# Patient Record
Sex: Male | Born: 1935 | Race: White | Hispanic: No | Marital: Single | Smoking: Never smoker
Health system: Southern US, Community
[De-identification: ages and names within clinical notes are randomized; demographics above are authoritative.]

## PROBLEM LIST (undated history)

## (undated) DIAGNOSIS — I10 Essential (primary) hypertension: Secondary | ICD-10-CM

## (undated) DIAGNOSIS — M1990 Unspecified osteoarthritis, unspecified site: Secondary | ICD-10-CM

## (undated) DIAGNOSIS — F0390 Unspecified dementia without behavioral disturbance: Secondary | ICD-10-CM

## (undated) DIAGNOSIS — E119 Type 2 diabetes mellitus without complications: Secondary | ICD-10-CM

## (undated) HISTORY — PX: NO PAST SURGERIES: SHX2092

---

## 2003-04-20 ENCOUNTER — Encounter: Payer: Self-pay | Admitting: Orthopaedic Surgery

## 2003-04-20 ENCOUNTER — Inpatient Hospital Stay (HOSPITAL_COMMUNITY): Admission: AD | Admit: 2003-04-20 | Discharge: 2003-04-22 | Payer: Self-pay | Admitting: Emergency Medicine

## 2003-08-14 ENCOUNTER — Encounter: Admission: RE | Admit: 2003-08-14 | Discharge: 2003-08-14 | Payer: Self-pay | Admitting: Orthopaedic Surgery

## 2003-09-04 ENCOUNTER — Encounter: Admission: RE | Admit: 2003-09-04 | Discharge: 2003-09-04 | Payer: Self-pay | Admitting: Orthopaedic Surgery

## 2003-09-22 ENCOUNTER — Encounter: Admission: RE | Admit: 2003-09-22 | Discharge: 2003-09-22 | Payer: Self-pay | Admitting: Orthopaedic Surgery

## 2003-10-09 ENCOUNTER — Encounter: Admission: RE | Admit: 2003-10-09 | Discharge: 2003-10-09 | Payer: Self-pay | Admitting: Orthopaedic Surgery

## 2005-11-15 ENCOUNTER — Encounter: Admission: RE | Admit: 2005-11-15 | Discharge: 2005-11-15 | Payer: Self-pay | Admitting: Orthopedic Surgery

## 2005-11-18 ENCOUNTER — Ambulatory Visit (HOSPITAL_BASED_OUTPATIENT_CLINIC_OR_DEPARTMENT_OTHER): Admission: RE | Admit: 2005-11-18 | Discharge: 2005-11-18 | Payer: Self-pay | Admitting: Orthopedic Surgery

## 2010-05-07 ENCOUNTER — Encounter: Admission: RE | Admit: 2010-05-07 | Discharge: 2010-05-07 | Payer: Self-pay | Admitting: Family Medicine
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
## 2010-12-10 NOTE — Op Note (Signed)
NAMEJANTZEN, Johnny Hayden               ACCOUNT NO.:  [id number]   MEDICAL RECORD NO.:  [id number]          PATIENT TYPE:  AMB   LOCATION:  DSC                          FACILITY:  [facility]   PHYSICIAN:  Katy Fitch. Sypher, M.D. 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   OPERATIONS:  Release of right transverse carpal ligament.   SURGEON:  Katy Fitch. Sypher, M.D.   ASSISTANT:  Marveen Reeks. Dasnoit, P.A.-C.   ANESTHESIA:  General by LMA.   SUPERVISING ANESTHESIOLOGIST:  Janetta Hora. Gelene Mink, M.D.   INDICATIONS:  Johnny Hayden is a 77 year old gentleman referred by Dr. Benedetto Goad of [facility] for evaluation and management of hand  numbness and discomfort.  Clinical examination revealed a well-appearing 7-  year-old gentleman.  He has a history of type 2 diabetes that is well  managed with oral hypoglycemic therapy and diet.  He had a signs of  diminished sensitivity in his median distribution bilaterally.  Electrodiagnostic were completed revealing moderately severe bilateral  carpal tunnel syndrome, right worse than left.  Due to a failure to respond  to nonoperative management, he is brought to the operating room at this time  for release of his right transverse carpal ligament.   PROCEDURE:  Johnny Hayden is brought to the operating room and placed in  supine position on the operating table.  Following the induction of general  anesthesia by LMA technique, the right arm was prepped with Betadine soap  solution and sterilely draped.  A pneumatic tourniquet applied to the  proximal right brachium.  Following exsanguination of the limb with an  Esmarch  bandage, the arterial tourniquet was inflated to 220 mmHg.   The procedure commenced with a short incision in the line of the ring finger  of the palm.  The subcutaneous tissues were carefully divided revealing the  palmar fascia.  This was spilt longitudinally to the common sensory branch  of the median nerve.  These were followed back to the transverse carpal  ligament which was gently isolated from the median nerve.  A Penfield #4  elevator was passed repeatedly to clear the bursa from the deep surface of  the transverse carpal ligament.  The ligament was then released along its  ulnar border extending into the distal forearm.  This widely opened the  carpal canal.  No mass or other predicaments were noted.   Bleeding points along the margin of the loose ligament were  electrocauterized with bipolar forceps.  The wound was then repaired with  intradermal 3-0 Prolene.  A compressive dressing was applied with a volar  plaster splint maintaining the wrist in 5 degrees of dorsiflexion.   Preoperatively, Johnny Hayden uric acid was checked.  He is on chronic  Allopurinol therapy.  His uric acid  6.1, which is normal.   He is advised to elevate his hand for the next four days.  He will return to  our office for follow-up one week for dressing change.  We anticipate  beginning a therapy program three weeks postop.      Katy Fitch Sypher, M.D.  Electronically Signed     RVS/MEDQ  D:  11/18/2005  T:  11/18/2005  Job:  [id number]   cc:   Gloriajean Dell. Andrey Campanile, M.D.  Fax: [phone]

## 2010-12-10 NOTE — Op Note (Signed)
NAME:  Johnny Hayden, Johnny Hayden                         ACCOUNT NO.:  [id number]   MEDICAL RECORD NO.:  [id number]                   PATIENT TYPE:  INP   LOCATION:  4731                                 FACILITY:  [facility]   PHYSICIAN:  Claude Manges. Cleophas Dunker, M.D.            DATE OF BIRTH:  [birth year]   DATE OF PROCEDURE:  04/20/2003  DATE OF DISCHARGE:                                 OPERATIVE REPORT   PREOPERATIVE DIAGNOSIS:  Displaced left lateral tibial plateau fracture  (split type).   POSTOPERATIVE DIAGNOSIS:  Displaced left lateral tibial plateau fracture  (split type).   PROCEDURE:  Open reduction and internal fixation.   SURGEON:  Claude Manges. Cleophas Dunker, M.D.   ASSISTANT:  Legrand Pitts. Duffy, P.A.   ANESTHESIA:  General orotracheal.   COMPLICATIONS:  None.   DESCRIPTION OF PROCEDURE:  With the patient comfortable on the operating  table and under general orotracheal anesthesia, the left lower extremity was  placed in a thigh tourniquet.  The leg was then prepped with Betadine scrub  and then Duraprep from the tourniquet to the midfoot.  Sterile draping was  performed.  With the extremity still elevated, it was Esmarch-exsanguinated  with a proximal tourniquet at 350 mmHg.   A longitudinal incision was made centered about 2 cm lateral to the tibial  crest.  Via sharp dissection the incision was carried down through the  subcutaneous tissue.  Upon entering the anterior compartment fascia, there  was obvious hematoma.  Fascia had been split and the tibial plateau fracture  was identified.  There was at least a centimeter in separation between the  lateral fragment and the main body of the tibia.  The longitudinal crack was  located through the lateral joint.  There was a large hematoma, which was  evacuated.  Irrigation was then performed to visualize the fracture site,  and there were small shards of bone that were removed as they were impairing  reduction.  A portion of the fascia was  then separated from the fragment  anteriorly to better visualize the fracture.  Several attempts were made to  reduce the fracture and finally with clamps above and proximal and distal,  we felt we had an excellent position with minimal offset and good closure of  the wound.  We felt at that point that rather than doing a lot of muscle-  stripping that we could stabilize the fracture with transverse screws.  The  Ace cannulated titanium screw set was utilized.  Maintaining the position of  the fracture with the clamps, a 4.5 cortical screw was placed in the distal  portion of the fragment.  Appropriate drilling was measuring with the screw,  and they had excellent fit of the screw with nice compression.   Two 6.5 cancellous screws were then placed more proximally across the  plateau after appropriate drilling and measuring.  The more proximal screw  was inserted with  a washer.  We had excellent compression across the  fracture site.  There was still some comminution of the cortex posteriorly  and distally, and we packed corticocancellous bone chips.  Image  intensification revealed excellent closure of the fracture gap.  There may  have been a millimeter or so of offset at the joint, but we were able to  visualize the joint, felt we had an excellent reduction.  The wound was  copiously irrigated with antibiotic and saline solution throughout the  operative procedure, placing the knee through a full extension and then over  95 degrees of flexion.  There was absolutely no motion across the fracture  site.  Again we checked the image intensification in AP and lateral  projections and felt we had an excellent fixation.   The deep fascia was closed with a running 0 Vicryl, the subcu with a 2-0  Vicryl, skin closed with skin clips.  Marcaine 0.25% without epinephrine  injected in the wound edges.  A sterile bulky dressing was applied, followed  by an Ace bandage and a knee immobilizer.   The  patient tolerated the procedure without complications.                                               Claude Manges. Cleophas Dunker, M.D.    PWW/MEDQ  D:  04/20/2003  T:  04/21/2003  Job:  [id number]

## 2012-03-13 ENCOUNTER — Other Ambulatory Visit: Payer: Self-pay | Admitting: Neurology

## 2012-03-13 DIAGNOSIS — R2681 Unsteadiness on feet: Secondary | ICD-10-CM

## 2012-03-16 ENCOUNTER — Ambulatory Visit (HOSPITAL_COMMUNITY)
Admission: RE | Admit: 2012-03-16 | Discharge: 2012-03-16 | Disposition: A | Discharge status: Home / Self Care | Payer: Medicare Other | Source: Ambulatory Visit | Attending: Neurology | Admitting: Neurology

## 2012-03-16 DIAGNOSIS — G319 Degenerative disease of nervous system, unspecified: Secondary | ICD-10-CM | POA: Insufficient documentation

## 2012-03-16 DIAGNOSIS — R2681 Unsteadiness on feet: Secondary | ICD-10-CM

## 2012-03-16 DIAGNOSIS — R269 Unspecified abnormalities of gait and mobility: Secondary | ICD-10-CM | POA: Insufficient documentation

## 2013-01-01 ENCOUNTER — Inpatient Hospital Stay (HOSPITAL_COMMUNITY)
Admission: EM | Admit: 2013-01-01 | Discharge: 2013-01-05 | DRG: 309 | Disposition: A | Discharge status: Skilled Nursing Facility | Payer: Medicare Other | Attending: Family Medicine | Admitting: Family Medicine

## 2013-01-01 ENCOUNTER — Inpatient Hospital Stay (HOSPITAL_COMMUNITY): Payer: Medicare Other

## 2013-01-01 ENCOUNTER — Emergency Department (HOSPITAL_COMMUNITY): Payer: Medicare Other

## 2013-01-01 ENCOUNTER — Encounter (HOSPITAL_COMMUNITY): Payer: Self-pay | Admitting: Emergency Medicine

## 2013-01-01 DIAGNOSIS — I498 Other specified cardiac arrhythmias: Secondary | ICD-10-CM | POA: Diagnosis present

## 2013-01-01 DIAGNOSIS — E1169 Type 2 diabetes mellitus with other specified complication: Secondary | ICD-10-CM | POA: Diagnosis present

## 2013-01-01 DIAGNOSIS — IMO0002 Reserved for concepts with insufficient information to code with codable children: Secondary | ICD-10-CM | POA: Diagnosis present

## 2013-01-01 DIAGNOSIS — E119 Type 2 diabetes mellitus without complications: Secondary | ICD-10-CM

## 2013-01-01 DIAGNOSIS — F0390 Unspecified dementia without behavioral disturbance: Secondary | ICD-10-CM | POA: Diagnosis present

## 2013-01-01 DIAGNOSIS — R404 Transient alteration of awareness: Secondary | ICD-10-CM | POA: Diagnosis present

## 2013-01-01 DIAGNOSIS — E1165 Type 2 diabetes mellitus with hyperglycemia: Secondary | ICD-10-CM | POA: Diagnosis present

## 2013-01-01 DIAGNOSIS — R410 Disorientation, unspecified: Secondary | ICD-10-CM | POA: Diagnosis present

## 2013-01-01 DIAGNOSIS — I4892 Unspecified atrial flutter: Principal | ICD-10-CM | POA: Diagnosis present

## 2013-01-01 DIAGNOSIS — N390 Urinary tract infection, site not specified: Secondary | ICD-10-CM | POA: Diagnosis present

## 2013-01-01 DIAGNOSIS — E162 Hypoglycemia, unspecified: Secondary | ICD-10-CM | POA: Diagnosis present

## 2013-01-01 DIAGNOSIS — B9689 Other specified bacterial agents as the cause of diseases classified elsewhere: Secondary | ICD-10-CM | POA: Diagnosis present

## 2013-01-01 DIAGNOSIS — R55 Syncope and collapse: Secondary | ICD-10-CM | POA: Diagnosis present

## 2013-01-01 DIAGNOSIS — R270 Ataxia, unspecified: Secondary | ICD-10-CM

## 2013-01-01 DIAGNOSIS — I10 Essential (primary) hypertension: Secondary | ICD-10-CM | POA: Diagnosis present

## 2013-01-01 HISTORY — DX: Unspecified osteoarthritis, unspecified site: M19.90

## 2013-01-01 HISTORY — DX: Type 2 diabetes mellitus without complications: E11.9

## 2013-01-01 HISTORY — DX: Essential (primary) hypertension: I10

## 2013-01-01 HISTORY — DX: Unspecified dementia, unspecified severity, without behavioral disturbance, psychotic disturbance, mood disturbance, and anxiety: F03.90

## 2013-01-01 LAB — URINALYSIS, ROUTINE W REFLEX MICROSCOPIC
Bilirubin Urine: NEGATIVE
Hgb urine dipstick: NEGATIVE
Nitrite: NEGATIVE
Protein, ur: NEGATIVE mg/dL
Specific Gravity, Urine: 1.018 (ref 1.005–1.030)
Urobilinogen, UA: 1 mg/dL (ref 0.0–1.0)

## 2013-01-01 LAB — HEPATIC FUNCTION PANEL
ALT: 26 U/L (ref 0–53)
AST: 34 U/L (ref 0–37)
Albumin: 3.5 g/dL (ref 3.5–5.2)
Indirect Bilirubin: 0.5 mg/dL (ref 0.3–0.9)
Total Protein: 6.6 g/dL (ref 6.0–8.3)

## 2013-01-01 LAB — CBC WITH DIFFERENTIAL/PLATELET
Basophils Absolute: 0 10*3/uL (ref 0.0–0.1)
Basophils Relative: 0 % (ref 0–1)
Eosinophils Absolute: 0 10*3/uL (ref 0.0–0.7)
HCT: 40.7 % (ref 39.0–52.0)
Hemoglobin: 14.8 g/dL (ref 13.0–17.0)
MCH: 33.5 pg (ref 26.0–34.0)
MCHC: 36.4 g/dL — ABNORMAL HIGH (ref 30.0–36.0)
Monocytes Absolute: 0.3 10*3/uL (ref 0.1–1.0)
Monocytes Relative: 6 % (ref 3–12)
Neutro Abs: 3.4 10*3/uL (ref 1.7–7.7)
Neutrophils Relative %: 79 % — ABNORMAL HIGH (ref 43–77)
RDW: 12.4 % (ref 11.5–15.5)

## 2013-01-01 LAB — POCT I-STAT, CHEM 8
BUN: 19 mg/dL (ref 6–23)
Calcium, Ion: 1.13 mmol/L (ref 1.13–1.30)
Creatinine, Ser: 0.8 mg/dL (ref 0.50–1.35)
Glucose, Bld: 105 mg/dL — ABNORMAL HIGH (ref 70–99)
Hemoglobin: 14.6 g/dL (ref 13.0–17.0)
TCO2: 23 mmol/L (ref 0–100)

## 2013-01-01 LAB — RAPID URINE DRUG SCREEN, HOSP PERFORMED: Benzodiazepines: NOT DETECTED

## 2013-01-01 MED ORDER — LORAZEPAM 2 MG/ML IJ SOLN
1.0000 mg | Freq: Four times a day (QID) | INTRAMUSCULAR | Status: AC | PRN
  Administered 2013-01-02 – 2013-01-04 (×4): 1 mg via INTRAVENOUS
  Filled 2013-01-01 (×4): qty 1

## 2013-01-01 MED ORDER — ADULT MULTIVITAMIN W/MINERALS CH
1.0000 | ORAL_TABLET | Freq: Every day | ORAL | Status: DC
  Administered 2013-01-01 – 2013-01-05 (×5): 1 via ORAL
  Filled 2013-01-01 (×5): qty 1

## 2013-01-01 MED ORDER — HYDRALAZINE HCL 20 MG/ML IJ SOLN
10.0000 mg | Freq: Four times a day (QID) | INTRAMUSCULAR | Status: DC | PRN
  Administered 2013-01-03: 10 mg via INTRAVENOUS
  Filled 2013-01-01: qty 0.5
  Filled 2013-01-01: qty 1

## 2013-01-01 MED ORDER — VITAMIN B-1 100 MG PO TABS
100.0000 mg | ORAL_TABLET | Freq: Every day | ORAL | Status: DC
  Administered 2013-01-01 – 2013-01-05 (×5): 100 mg via ORAL
  Filled 2013-01-01 (×5): qty 1

## 2013-01-01 MED ORDER — HALOPERIDOL LACTATE 5 MG/ML IJ SOLN
1.0000 mg | Freq: Four times a day (QID) | INTRAMUSCULAR | Status: DC | PRN
  Administered 2013-01-01 – 2013-01-04 (×4): 1 mg via INTRAVENOUS
  Filled 2013-01-01 (×4): qty 1

## 2013-01-01 MED ORDER — ONDANSETRON HCL 4 MG PO TABS: 4.0000 mg | ORAL_TABLET | Freq: Four times a day (QID) | ORAL | Status: DC | PRN

## 2013-01-01 MED ORDER — THIAMINE HCL 100 MG/ML IJ SOLN
100.0000 mg | Freq: Every day | INTRAMUSCULAR | Status: DC
  Filled 2013-01-01 (×4): qty 1

## 2013-01-01 MED ORDER — LORAZEPAM 1 MG PO TABS
1.0000 mg | ORAL_TABLET | Freq: Four times a day (QID) | ORAL | Status: AC | PRN
  Administered 2013-01-01: 1 mg via ORAL
  Filled 2013-01-01 (×2): qty 1

## 2013-01-01 MED ORDER — SODIUM CHLORIDE 0.9 % IV BOLUS (SEPSIS)
1000.0000 mL | Freq: Once | INTRAVENOUS | Status: AC
  Administered 2013-01-01: 1000 mL via INTRAVENOUS

## 2013-01-01 MED ORDER — SODIUM CHLORIDE 0.9 % IJ SOLN
3.0000 mL | Freq: Two times a day (BID) | INTRAMUSCULAR | Status: DC
  Administered 2013-01-01 – 2013-01-04 (×6): 3 mL via INTRAVENOUS

## 2013-01-01 MED ORDER — FOLIC ACID 1 MG PO TABS
1.0000 mg | ORAL_TABLET | Freq: Every day | ORAL | Status: DC
  Administered 2013-01-01 – 2013-01-05 (×5): 1 mg via ORAL
  Filled 2013-01-01 (×5): qty 1

## 2013-01-01 MED ORDER — ONDANSETRON HCL 4 MG/2ML IJ SOLN: 4.0000 mg | Freq: Four times a day (QID) | INTRAMUSCULAR | Status: DC | PRN

## 2013-01-01 MED ORDER — HYDROCODONE-ACETAMINOPHEN 5-325 MG PO TABS: 1.0000 | ORAL_TABLET | ORAL | Status: DC | PRN

## 2013-01-01 MED ORDER — POLYETHYLENE GLYCOL 3350 17 G PO PACK
17.0000 g | PACK | Freq: Every day | ORAL | Status: DC | PRN
  Administered 2013-01-04: 17 g via ORAL
  Filled 2013-01-01 (×2): qty 1

## 2013-01-01 MED ORDER — METOPROLOL TARTRATE 50 MG PO TABS
50.0000 mg | ORAL_TABLET | Freq: Two times a day (BID) | ORAL | Status: DC
  Administered 2013-01-01 – 2013-01-02 (×2): 50 mg via ORAL
  Filled 2013-01-01: qty 2
  Filled 2013-01-01 (×3): qty 1

## 2013-01-01 MED ORDER — GUAIFENESIN-DM 100-10 MG/5ML PO SYRP: 5.0000 mL | ORAL_SOLUTION | ORAL | Status: DC | PRN

## 2013-01-01 MED ORDER — KCL IN DEXTROSE-NACL 10-5-0.45 MEQ/L-%-% IV SOLN
INTRAVENOUS | Status: AC
  Administered 2013-01-01: 18:00:00 via INTRAVENOUS
  Filled 2013-01-01 (×2): qty 1000

## 2013-01-01 MED ORDER — ALBUTEROL SULFATE (5 MG/ML) 0.5% IN NEBU: 2.5000 mg | INHALATION_SOLUTION | RESPIRATORY_TRACT | Status: DC | PRN

## 2013-01-01 MED ORDER — MAGNESIUM SULFATE 40 MG/ML IJ SOLN
2.0000 g | Freq: Once | INTRAMUSCULAR | Status: AC
  Administered 2013-01-01: 2 g via INTRAVENOUS
  Filled 2013-01-01: qty 50
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
## 2013-01-01 NOTE — Progress Notes (Signed)
UR completed 
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
## 2013-01-01 NOTE — Progress Notes (Signed)
Patient's sister Christella Noa) in to visit with patient. Sister states " she would like to speak with Social Work or Case Management concerning brothers well being.   Christella Noa: [phone] contact number. She resides in [place], [place].  Number also placed to chart front. Also informed us that patient likes to be called "Fields".

## 2013-01-01 NOTE — Progress Notes (Signed)
Consulted by ED SW Paged Dr Thedore Mins at [phone]

## 2013-01-01 NOTE — Progress Notes (Addendum)
Acknowledged lead off alarm, phone not yet transferred to me. Called Chrissie at the [facility] [employer] to inform of replaced leads. Pt confused continues to pull off leads and pulled out IV.  Prn Ativan1 mg PO given. IV team called for restart. Pt placed on Bed alarm and Nurse Tech updated concerning patients current status. Per secretary relative called and will be visiting to check on Patients condition. Will continue to monitor patient per Doctor orders and unit protocol.

## 2013-01-01 NOTE — Progress Notes (Signed)
CSW attempted to assess patient in the emergency department, however pt was being transported to inpatient floor. CSW will inform unit csw of patient needs.   Catha Gosselin, LCSWA  [phone] 01/01/2013 1607pm

## 2013-01-01 NOTE — Progress Notes (Signed)
   CARE MANAGEMENT ED NOTE 01/01/2013  Patient:  Johnny Hayden, Johnny Hayden   Account Number:  [id number]  Date Initiated:  01/01/2013  Documentation initiated by:    Subjective/Objective Assessment:     Subjective/Objective Assessment Detail:     Action/Plan:   Action/Plan Detail:   Anticipated DC Date:       Status Recommendation to Physician:   Result of Recommendation:    Other ED Services  Consult Working Plan    DC Planning Services  Other  PCP issues    Choice offered to / List presented to:            Status of service:  Completed, signed off  ED Comments:   ED Comments Detail:  Patient listed as not having a PCP.  EDCM spoke to patient who stated that Dr. Gay Filler of [facility] is his primary doctor.  Offered support to patient.  No further needs at this time.

## 2013-01-01 NOTE — ED Provider Notes (Signed)
History     CSN: [id number]  Arrival date & time 01/01/13  1229   First MD Initiated Contact with Patient 01/01/13 1241      Chief Complaint  Patient presents with  . Hypoglycemia    (Consider location/radiation/quality/duration/timing/severity/associated sxs/prior treatment) Patient is a 79 y.o. male presenting with altered mental status. The history is provided by the EMS personnel (the pt had been living outside for 3 days and could not get in his house). No language interpreter was used.  Altered Mental Status Presenting symptoms: behavior changes   Presenting symptoms: no combativeness   Severity:  Moderate Most recent episode:  Today Episode history:  Multiple Timing:  Constant Progression:  Unchanged   Past Medical History  Diagnosis Date  . Hypertension   . Diabetes mellitus without complication     History reviewed. No pertinent past surgical history.  History reviewed. No pertinent family history.  History  Substance Use Topics  . Smoking status: Not on file  . Smokeless tobacco: Not on file  . Alcohol Use: Not on file      Review of Systems  Unable to perform ROS: Dementia  Psychiatric/Behavioral: Positive for altered mental status.    Allergies  Review of patient's allergies indicates not on file.  Home Medications  No current outpatient prescriptions on file.  BP 177/83  Pulse 64  Temp(Src) 97.5 F (36.4 C) (Oral)  Resp 13  SpO2 98%  Physical Exam  Constitutional: He appears well-developed.  HENT:  Head: Normocephalic.  Eyes: Conjunctivae and EOM are normal. No scleral icterus.  Neck: Neck supple. No thyromegaly present.  Cardiovascular: Normal rate and regular rhythm.  Exam reveals no gallop and no friction rub.   No murmur heard. Pulmonary/Chest: No stridor. He has no wheezes. He has no rales. He exhibits no tenderness.  Abdominal: He exhibits no distension. There is no tenderness. There is no rebound.  Musculoskeletal: Normal  range of motion. He exhibits no edema.  Lymphadenopathy:    He has no cervical adenopathy.  Neurological: He is alert. Coordination normal.  Pt only oriented to person.  Pt also ataxic  Skin: No rash noted. No erythema.  Psychiatric: He has a normal mood and affect. His behavior is normal.    ED Course  Procedures (including critical care time)  Labs Reviewed  CBC WITH DIFFERENTIAL - Abnormal; Notable for the following:    MCHC 36.4 (*)    Neutrophils Relative % 79 (*)    Lymphs Abs 0.6 (*)    All other components within normal limits  GLUCOSE, CAPILLARY - Abnormal; Notable for the following:    Glucose-Capillary 67 (*)    All other components within normal limits  POCT I-STAT, CHEM 8 - Abnormal; Notable for the following:    Glucose, Bld 105 (*)    All other components within normal limits  URINE CULTURE  URINALYSIS, ROUTINE W REFLEX MICROSCOPIC  HEPATIC FUNCTION PANEL  ETHANOL  CK  HEMOGLOBIN A1C  URINE RAPID DRUG SCREEN (HOSP PERFORMED)  C-PEPTIDE  TSH   Dg Chest 1 View  01/01/2013   *RADIOLOGY REPORT*  Clinical Data: Fall, weakness  CHEST - 1 VIEW  Comparison: 11/15/2005  Findings: Cardiomediastinal silhouette is stable.  No acute infiltrate or pleural effusion.  No pulmonary edema.  Degenerative changes are noted right AC joint.  IMPRESSION: No active disease.   Original Report Authenticated By: Natasha Mead, M.D.   Ct Head Wo Contrast  01/01/2013   *RADIOLOGY REPORT*  Clinical Data:  Hypoglycemia  CT HEAD WITHOUT CONTRAST CT MAXILLOFACIAL WITHOUT CONTRAST CT CERVICAL SPINE WITHOUT CONTRAST  Technique:  Multidetector CT imaging of the head, cervical spine, and maxillofacial structures were performed using the standard protocol without intravenous contrast. Multiplanar CT image reconstructions of the cervical spine and maxillofacial structures were also generated.  Comparison:   None  CT HEAD  Findings: The ventricular system is prominent, as are the cortical sulci diffusely  thickened, consistent with atrophy.  Moderate small vessel ischemic change is noted throughout the periventricular white matter.  No hemorrhage, mass lesion, or acute infarction is seen.  On bone window images, no calvarial abnormality is noted. The paranasal sinuses that are visualized are clear.  IMPRESSION: Moderate atrophy and small vessel ischemic change.  No acute intracranial abnormality.  CT MAXILLOFACIAL  Findings:  No maxillofacial fracture is seen.  The orbital rims and zygomatic arches are intact.  No nasal bone fracture is seen. There is a probable retention cyst noted in the anterior left maxillary sinus, but no sinusitis is seen.  The nasal turbinates are normal in size and position.  IMPRESSION: No maxillofacial fracture is seen.  The sinuses are clear.  CT CERVICAL SPINE  Findings:   The cervical vertebrae are in normal alignment.  There is degenerative disc disease primarily at C4-5, C5-6, and C6-7 levels with loss of disc space and spurring.  Posterior osteophytes at C5-6 and C6-7 levels somewhat narrow the central canal.  No acute fracture is seen.  The odontoid process is intact.  There are diffuse degenerative changes throughout the facet joints of the cervical spine.  Foraminal narrowing is noted at multiple levels as well.  The thyroid gland is unremarkable.  IMPRESSION:  1.  Diffuse degenerative disc disease with some foraminal narrowing and mild central canal narrowing at several levels.  2.  Osteopenia.  No acute fracture.   Original Report Authenticated By: Dwyane Dee, M.D.   Ct Cervical Spine Wo Contrast  01/01/2013   *RADIOLOGY REPORT*  Clinical Data:  Hypoglycemia  CT HEAD WITHOUT CONTRAST CT MAXILLOFACIAL WITHOUT CONTRAST CT CERVICAL SPINE WITHOUT CONTRAST  Technique:  Multidetector CT imaging of the head, cervical spine, and maxillofacial structures were performed using the standard protocol without intravenous contrast. Multiplanar CT image reconstructions of the cervical spine  and maxillofacial structures were also generated.  Comparison:   None  CT HEAD  Findings: The ventricular system is prominent, as are the cortical sulci diffusely thickened, consistent with atrophy.  Moderate small vessel ischemic change is noted throughout the periventricular white matter.  No hemorrhage, mass lesion, or acute infarction is seen.  On bone window images, no calvarial abnormality is noted. The paranasal sinuses that are visualized are clear.  IMPRESSION: Moderate atrophy and small vessel ischemic change.  No acute intracranial abnormality.  CT MAXILLOFACIAL  Findings:  No maxillofacial fracture is seen.  The orbital rims and zygomatic arches are intact.  No nasal bone fracture is seen. There is a probable retention cyst noted in the anterior left maxillary sinus, but no sinusitis is seen.  The nasal turbinates are normal in size and position.  IMPRESSION: No maxillofacial fracture is seen.  The sinuses are clear.  CT CERVICAL SPINE  Findings:   The cervical vertebrae are in normal alignment.  There is degenerative disc disease primarily at C4-5, C5-6, and C6-7 levels with loss of disc space and spurring.  Posterior osteophytes at C5-6 and C6-7 levels somewhat narrow the central canal.  No acute fracture is seen.  The odontoid process is intact.  There are diffuse degenerative changes throughout the facet joints of the cervical spine.  Foraminal narrowing is noted at multiple levels as well.  The thyroid gland is unremarkable.  IMPRESSION:  1.  Diffuse degenerative disc disease with some foraminal narrowing and mild central canal narrowing at several levels.  2.  Osteopenia.  No acute fracture.   Original Report Authenticated By: Dwyane Dee, M.D.   Ct Maxillofacial Wo Cm  01/01/2013   *RADIOLOGY REPORT*  Clinical Data:  Hypoglycemia  CT HEAD WITHOUT CONTRAST CT MAXILLOFACIAL WITHOUT CONTRAST CT CERVICAL SPINE WITHOUT CONTRAST  Technique:  Multidetector CT imaging of the head, cervical spine, and  maxillofacial structures were performed using the standard protocol without intravenous contrast. Multiplanar CT image reconstructions of the cervical spine and maxillofacial structures were also generated.  Comparison:   None  CT HEAD  Findings: The ventricular system is prominent, as are the cortical sulci diffusely thickened, consistent with atrophy.  Moderate small vessel ischemic change is noted throughout the periventricular white matter.  No hemorrhage, mass lesion, or acute infarction is seen.  On bone window images, no calvarial abnormality is noted. The paranasal sinuses that are visualized are clear.  IMPRESSION: Moderate atrophy and small vessel ischemic change.  No acute intracranial abnormality.  CT MAXILLOFACIAL  Findings:  No maxillofacial fracture is seen.  The orbital rims and zygomatic arches are intact.  No nasal bone fracture is seen. There is a probable retention cyst noted in the anterior left maxillary sinus, but no sinusitis is seen.  The nasal turbinates are normal in size and position.  IMPRESSION: No maxillofacial fracture is seen.  The sinuses are clear.  CT CERVICAL SPINE  Findings:   The cervical vertebrae are in normal alignment.  There is degenerative disc disease primarily at C4-5, C5-6, and C6-7 levels with loss of disc space and spurring.  Posterior osteophytes at C5-6 and C6-7 levels somewhat narrow the central canal.  No acute fracture is seen.  The odontoid process is intact.  There are diffuse degenerative changes throughout the facet joints of the cervical spine.  Foraminal narrowing is noted at multiple levels as well.  The thyroid gland is unremarkable.  IMPRESSION:  1.  Diffuse degenerative disc disease with some foraminal narrowing and mild central canal narrowing at several levels.  2.  Osteopenia.  No acute fracture.   Original Report Authenticated By: Dwyane Dee, M.D.     1. Diabetes mellitus without complication   2. Hypertension   3. Ataxia   4. Hypoglycemia        MDM          Benny Lennert, MD 01/01/13 1453

## 2013-01-01 NOTE — H&P (Signed)
[facility]                                                                                    Patient Demographics  Johnny Hayden, is a 79 y.o. male  CSN: [id number]  MRN: [id number]  DOB - 1932-07-17  Admit Date - 01/01/2013  Outpatient Primary MD for the patient is No primary provider on file.   With History of -  Past Medical History  Diagnosis Date  . Hypertension   . Diabetes mellitus without complication   . Arthritis   . Dementia       Past Surgical History  Procedure Laterality Date  . No past surgeries      in for   Chief Complaint  Patient presents with  . Hypoglycemia     HPI  Johnny Hayden  is a 79 y.o. male,  with history of advanced dementia and a very poor historian, 2 diabetes mellitus, hypertension, arthritis, who lives alone and according to his sister who are talked to over the phone is in poor state of health, according to the EMS report neighbors reported that patient was found lying in his front yard and was apparently locked out of his house for the past few days, patient himself is quite confused however he says that he has been lightheaded and falling over and over again for the last several days, he does not remember when he walked out of his house, he says he has multiple injuries all over but he mostly hurts over his right hip, when EMS found him he was lying in his front yard, he was found to have extensive bruising all over, he was also found to be hypoglycemic with a CBG of 56, he was then brought to the hospital for recurrent syncope, recurrent falls, hypoglycemia, in the ER his EKG was suggestive of possible atrial flutter versus sinus rhythm with artifact and PVCs, I was called to admit the patient for recurrent syncope with falls, hyperglycemia, possible atrial flutter of new origin.  Patient himself is a very poor historian due to his advanced dementia and can provide very limited history, he denies any headache or chest pain, no  palpitations, he does confirm recurrent syncopal episodes and falls whenever he tries to stand up, he agrees to right-sided hip pain. Does not remember what medications he takes, does not remember about his health issues besides that he is diabetic, he does not remember his PCP.    Review of Systems    In addition to the HPI above,  No Fever-chills, No Headache, No changes with Vision or hearing, No problems swallowing food or Liquids, No Chest pain, Cough or Shortness of Breath, No Abdominal pain, No Nausea or Vommitting, Bowel movements are regular, No Blood in stool or Urine, No dysuria, No new skin rashes or bruises, No new joints pains-aches, except some pain in the right hip, No new weakness, tingling, numbness in any extremity, he is persistently dizzy and syncopal when he stands up with multiple falls No recent weight gain or loss, No polyuria, polydypsia or polyphagia, No significant Mental Stressors.  A full 10 point Review of Systems was done, except  as stated above, all other Review of Systems were negative.   Social History History  Substance Use Topics  . Smoking status: Never Smoker   . Smokeless tobacco: Never Used  . Alcohol Use: Yes     Comment: "too much sometimes"      Family History CAD and patient's father per old chart  Prior to Admission medications   Not on File    Not on File  Physical Exam  Vitals  Blood pressure 177/83, pulse 64, temperature 97.5 F (36.4 C), temperature source Oral, resp. rate 13, SpO2 98.00%.   1. General frail elderly white male who is pleasantly confused, and extremely poor personal hygiene with multiple external bruising and small lacerations all over sitting in hospital bed in no apparent discomfort.  2. Normal affect and insight, Not Suicidal or Homicidal, Awake presently confused is oriented to place only  3. No F.N deficits, ALL C.Nerves Intact, Strength 5/5 all 4 extremities, Sensation intact all 4  extremities, Plantars down going.  4. Ears and Eyes appear Normal, Conjunctivae clear, PERRLA. Moist Oral Mucosa.  5. Supple Neck, No JVD, No cervical lymphadenopathy appriciated, No Carotid Bruits.  6. Symmetrical Chest wall movement, Good air movement bilaterally, CTAB.  7. RRR, No Gallops, Rubs or Murmurs, No Parasternal Heave.  8. Positive Bowel Sounds, Abdomen Soft, Non tender, No organomegaly appriciated,No rebound -guarding or rigidity.  9.  No Cyanosis, Normal Skin Turgor, No Skin Rash , multiple extensive bruises  10. Good muscle tone,  joints appear normal , no effusions, Normal ROM.  11. No Palpable Lymph Nodes in Neck or Axillae     Data Review  CBC  Recent Labs Lab 01/01/13 1305 01/01/13 1311  WBC 4.3  --   HGB 14.8 14.6  HCT 40.7 43.0  PLT 160  --   MCV 92.1  --   MCH 33.5  --   MCHC 36.4*  --   RDW 12.4  --   LYMPHSABS 0.6*  --   MONOABS 0.3  --   EOSABS 0.0  --   BASOSABS 0.0  --    ------------------------------------------------------------------------------------------------------------------  Chemistries   Recent Labs Lab 01/01/13 1311  NA 138  K 3.5  CL 101  GLUCOSE 105*  BUN 19  CREATININE 0.80   ------------------------------------------------------------------------------------------------------------------ CrCl is unknown because there is no height on file for the current visit. ------------------------------------------------------------------------------------------------------------------ No results found for this basename: TSH, T4TOTAL, FREET3, T3FREE, THYROIDAB,  in the last 72 hours   Coagulation profile No results found for this basename: INR, PROTIME,  in the last 168 hours ------------------------------------------------------------------------------------------------------------------- No results found for this basename: DDIMER,  in the last 72  hours -------------------------------------------------------------------------------------------------------------------  Cardiac Enzymes No results found for this basename: CK, CKMB, TROPONINI, MYOGLOBIN,  in the last 168 hours ------------------------------------------------------------------------------------------------------------------ No components found with this basename: POCBNP,    ---------------------------------------------------------------------------------------------------------------  Urinalysis No results found for this basename: colorurine, appearanceur, labspec, phurine, glucoseu, hgbur, bilirubinur, ketonesur, proteinur, urobilinogen, nitrite, leukocytesur    ----------------------------------------------------------------------------------------------------------------  Imaging results:   Dg Chest 1 View  01/01/2013   *RADIOLOGY REPORT*  Clinical Data: Fall, weakness  CHEST - 1 VIEW  Comparison: 11/15/2005  Findings: Cardiomediastinal silhouette is stable.  No acute infiltrate or pleural effusion.  No pulmonary edema.  Degenerative changes are noted right AC joint.  IMPRESSION: No active disease.   Original Report Authenticated By: Natasha Mead, M.D.   Ct Head Wo Contrast  01/01/2013   *RADIOLOGY REPORT*  Clinical Data:  Hypoglycemia  CT  HEAD WITHOUT CONTRAST CT MAXILLOFACIAL WITHOUT CONTRAST CT CERVICAL SPINE WITHOUT CONTRAST  Technique:  Multidetector CT imaging of the head, cervical spine, and maxillofacial structures were performed using the standard protocol without intravenous contrast. Multiplanar CT image reconstructions of the cervical spine and maxillofacial structures were also generated.  Comparison:   None  CT HEAD  Findings: The ventricular system is prominent, as are the cortical sulci diffusely thickened, consistent with atrophy.  Moderate small vessel ischemic change is noted throughout the periventricular white matter.  No hemorrhage, mass lesion, or  acute infarction is seen.  On bone window images, no calvarial abnormality is noted. The paranasal sinuses that are visualized are clear.  IMPRESSION: Moderate atrophy and small vessel ischemic change.  No acute intracranial abnormality.  CT MAXILLOFACIAL  Findings:  No maxillofacial fracture is seen.  The orbital rims and zygomatic arches are intact.  No nasal bone fracture is seen. There is a probable retention cyst noted in the anterior left maxillary sinus, but no sinusitis is seen.  The nasal turbinates are normal in size and position.  IMPRESSION: No maxillofacial fracture is seen.  The sinuses are clear.  CT CERVICAL SPINE  Findings:   The cervical vertebrae are in normal alignment.  There is degenerative disc disease primarily at C4-5, C5-6, and C6-7 levels with loss of disc space and spurring.  Posterior osteophytes at C5-6 and C6-7 levels somewhat narrow the central canal.  No acute fracture is seen.  The odontoid process is intact.  There are diffuse degenerative changes throughout the facet joints of the cervical spine.  Foraminal narrowing is noted at multiple levels as well.  The thyroid gland is unremarkable.  IMPRESSION:  1.  Diffuse degenerative disc disease with some foraminal narrowing and mild central canal narrowing at several levels.  2.  Osteopenia.  No acute fracture.   Original Report Authenticated By: Dwyane Dee, M.D.   Ct Cervical Spine Wo Contrast  01/01/2013   *RADIOLOGY REPORT*  Clinical Data:  Hypoglycemia  CT HEAD WITHOUT CONTRAST CT MAXILLOFACIAL WITHOUT CONTRAST CT CERVICAL SPINE WITHOUT CONTRAST  Technique:  Multidetector CT imaging of the head, cervical spine, and maxillofacial structures were performed using the standard protocol without intravenous contrast. Multiplanar CT image reconstructions of the cervical spine and maxillofacial structures were also generated.  Comparison:   None  CT HEAD  Findings: The ventricular system is prominent, as are the cortical sulci diffusely  thickened, consistent with atrophy.  Moderate small vessel ischemic change is noted throughout the periventricular white matter.  No hemorrhage, mass lesion, or acute infarction is seen.  On bone window images, no calvarial abnormality is noted. The paranasal sinuses that are visualized are clear.  IMPRESSION: Moderate atrophy and small vessel ischemic change.  No acute intracranial abnormality.  CT MAXILLOFACIAL  Findings:  No maxillofacial fracture is seen.  The orbital rims and zygomatic arches are intact.  No nasal bone fracture is seen. There is a probable retention cyst noted in the anterior left maxillary sinus, but no sinusitis is seen.  The nasal turbinates are normal in size and position.  IMPRESSION: No maxillofacial fracture is seen.  The sinuses are clear.  CT CERVICAL SPINE  Findings:   The cervical vertebrae are in normal alignment.  There is degenerative disc disease primarily at C4-5, C5-6, and C6-7 levels with loss of disc space and spurring.  Posterior osteophytes at C5-6 and C6-7 levels somewhat narrow the central canal.  No acute fracture is seen.  The odontoid process  is intact.  There are diffuse degenerative changes throughout the facet joints of the cervical spine.  Foraminal narrowing is noted at multiple levels as well.  The thyroid gland is unremarkable.  IMPRESSION:  1.  Diffuse degenerative disc disease with some foraminal narrowing and mild central canal narrowing at several levels.  2.  Osteopenia.  No acute fracture.   Original Report Authenticated By: Dwyane Dee, M.D.   Ct Maxillofacial Wo Cm  01/01/2013   *RADIOLOGY REPORT*  Clinical Data:  Hypoglycemia  CT HEAD WITHOUT CONTRAST CT MAXILLOFACIAL WITHOUT CONTRAST CT CERVICAL SPINE WITHOUT CONTRAST  Technique:  Multidetector CT imaging of the head, cervical spine, and maxillofacial structures were performed using the standard protocol without intravenous contrast. Multiplanar CT image reconstructions of the cervical spine and  maxillofacial structures were also generated.  Comparison:   None  CT HEAD  Findings: The ventricular system is prominent, as are the cortical sulci diffusely thickened, consistent with atrophy.  Moderate small vessel ischemic change is noted throughout the periventricular white matter.  No hemorrhage, mass lesion, or acute infarction is seen.  On bone window images, no calvarial abnormality is noted. The paranasal sinuses that are visualized are clear.  IMPRESSION: Moderate atrophy and small vessel ischemic change.  No acute intracranial abnormality.  CT MAXILLOFACIAL  Findings:  No maxillofacial fracture is seen.  The orbital rims and zygomatic arches are intact.  No nasal bone fracture is seen. There is a probable retention cyst noted in the anterior left maxillary sinus, but no sinusitis is seen.  The nasal turbinates are normal in size and position.  IMPRESSION: No maxillofacial fracture is seen.  The sinuses are clear.  CT CERVICAL SPINE  Findings:   The cervical vertebrae are in normal alignment.  There is degenerative disc disease primarily at C4-5, C5-6, and C6-7 levels with loss of disc space and spurring.  Posterior osteophytes at C5-6 and C6-7 levels somewhat narrow the central canal.  No acute fracture is seen.  The odontoid process is intact.  There are diffuse degenerative changes throughout the facet joints of the cervical spine.  Foraminal narrowing is noted at multiple levels as well.  The thyroid gland is unremarkable.  IMPRESSION:  1.  Diffuse degenerative disc disease with some foraminal narrowing and mild central canal narrowing at several levels.  2.  Osteopenia.  No acute fracture.   Original Report Authenticated By: Dwyane Dee, M.D.    My personal review of EKG: Rhythm NSR with PVCs and artifact versus atrial flutter,  no Acute ST changes    Assessment & Plan    1. Recurrent syncope and falls - unclear etiology, possible new onset atrial flutter on EKG, patient will be admitted to  a telemetry bed, syncope workup will be initiated which will include tele monitoring, echogram, carotid duplex ultrasound, checking orthostatics, CT of the brain is unremarkable will check MRI of the brain, PT will evaluate, we'll also have social work evaluate for placement.   2. Questionable new onset atrial flutter. This could be artifact on EKG with frequent PVCs, his baseline is extremely poor due to recurrent motion, he will be monitored on telemetry, will check TSH and echogram, will place him on better blocker but for his hypertension and for rate control, off note he will be a very poor candidate for long-term anticoagulation regardless of his chair to score due to his advanced dementia and extremely high risk of fall. QTC was close to 500, we'll give him 1 g of IV  magnesium and monitor magnesium levels daily.   3. Diabetes mellitus type 2 history with hypoglycemia. Unclear what medications he takes, he was apparently locked out of his house for 3 days without any food, hypoglycemia protocol, monitor CBGs closely, IV fluids with D5, check A1c and C-peptide.   4. Multiple bruising, right-sided hip pain. We'll check a CK level as he has been out of the house and has had recurrent falls, will also check x-ray of his right hip. PT evaluation.   5. Advanced dementia will be an extremely high risk for delirium.- Fall and aspiration precautions, when necessary Ativan and Haldol with close QTC monitoring on telemetry. Check baseline B12.   6. History of alcohol use/abuse. He does not give a reliable history, sister says that he drinks quite a bit, I will check his B12 level, place him on thiamine and folate, when necessary IV Ativan per CIWA protocol.   7. Hypertension. Place him on Lopressor and monitor.    DVT Prophylaxis  SCDs    AM Labs Ordered, also please review Full Orders  Family Communication: Admission, patients condition and plan of care including tests being ordered have been  discussed with the patient and sister (phone) who indicate understanding and agree with the plan and Code Status.  Code Status Full  Likely DC to  SNF  Time spent in minutes : 35  Condition GUARDED   Leroy Sea M.D on 01/01/2013 at 3:17 PM  Between 7am to 7pm - Pager - [phone]  After 7pm go to [email or web address] - password [email or web address]  And look for the night coverage person covering me after hours  [facility] Office  [phone]

## 2013-01-01 NOTE — Progress Notes (Signed)
ED CM spoke with Dr Thedore Mins who states "patient is meeting inpatient" Pending H&P

## 2013-01-01 NOTE — ED Notes (Signed)
Patient transported to X-ray 

## 2013-01-02 DIAGNOSIS — R55 Syncope and collapse: Secondary | ICD-10-CM

## 2013-01-02 DIAGNOSIS — I359 Nonrheumatic aortic valve disorder, unspecified: Secondary | ICD-10-CM

## 2013-01-02 LAB — HEMOGLOBIN A1C
Hgb A1c MFr Bld: 5.2 % (ref <5.7)
Mean Plasma Glucose: 103 mg/dL (ref <117)

## 2013-01-02 LAB — CBC
HCT: 36.1 % — ABNORMAL LOW (ref 39.0–52.0)
MCV: 91.9 fL (ref 78.0–100.0)
Platelets: 132 10*3/uL — ABNORMAL LOW (ref 150–400)
RBC: 3.93 MIL/uL — ABNORMAL LOW (ref 4.22–5.81)
RDW: 12.5 % (ref 11.5–15.5)
WBC: 4.4 10*3/uL (ref 4.0–10.5)

## 2013-01-02 LAB — GLUCOSE, CAPILLARY
Glucose-Capillary: 124 mg/dL — ABNORMAL HIGH (ref 70–99)
Glucose-Capillary: 103 mg/dL — ABNORMAL HIGH (ref 70–99)
Glucose-Capillary: 110 mg/dL — ABNORMAL HIGH (ref 70–99)
Glucose-Capillary: 138 mg/dL — ABNORMAL HIGH (ref 70–99)

## 2013-01-02 LAB — C-PEPTIDE: C-Peptide: 1.62 ng/mL (ref 0.80–3.90)

## 2013-01-02 LAB — BASIC METABOLIC PANEL
BUN: 14 mg/dL (ref 6–23)
CO2: 30 mEq/L (ref 19–32)
Chloride: 104 mEq/L (ref 96–112)
GFR calc Af Amer: >90 mL/min (ref >90)
Potassium: 4.1 mEq/L (ref 3.5–5.1)

## 2013-01-02 LAB — SYPHILIS: RPR W/REFLEX TO RPR TITER AND TREPONEMAL ANTIBODIES, TRADITIONAL SCREENING AND DIAGNOSIS ALGORITHM: RPR Ser Ql: NONREACTIVE

## 2013-01-02 LAB — MAGNESIUM: Magnesium: 2.1 mg/dL (ref 1.5–2.5)

## 2013-01-02 LAB — VITAMIN B12: Vitamin B-12: 1283 pg/mL — ABNORMAL HIGH (ref 211–911)

## 2013-01-02 LAB — FOLATE: Folate: >20 ng/mL

## 2013-01-02 MED ORDER — METOPROLOL TARTRATE 12.5 MG HALF TABLET
12.5000 mg | ORAL_TABLET | Freq: Two times a day (BID) | ORAL | Status: DC
  Administered 2013-01-03 – 2013-01-05 (×4): 12.5 mg via ORAL
  Filled 2013-01-02 (×6): qty 1

## 2013-01-02 MED ORDER — HALOPERIDOL LACTATE 5 MG/ML IJ SOLN
2.5000 mg | Freq: Once | INTRAMUSCULAR | Status: AC
  Administered 2013-01-02: 2.5 mg via INTRAVENOUS
  Filled 2013-01-02: qty 1

## 2013-01-02 MED ORDER — DONEPEZIL HCL 5 MG PO TABS
5.0000 mg | ORAL_TABLET | Freq: Every day | ORAL | Status: DC
  Administered 2013-01-02 – 2013-01-04 (×2): 5 mg via ORAL
  Filled 2013-01-02 (×4): qty 1

## 2013-01-02 NOTE — Progress Notes (Signed)
[facility] PROGRESS NOTE  Johnny Hayden AVW:[id number] DOB: 1932-06-07 DOA: 01/01/2013 PCP: No primary provider on file.  Assessment/Plan: 1. Delirium vs dementia - sister reports that patient has a history of alcohol use - will complicate disposition as sister reports that patient likes to live independently.  Will consult psychiatry to assess whether or not patient has mental capacity to make his own medical decisions. - MRI showed no acute infarct but does show global atrophy - Vit b12 is elevated, TSH wnl, hgba1c wnl,  will order folate and rpr - UDS was negative  2. DM - hemoglobin a1c within normal limits at 5.2  - would plan on holding metformin and treating with diabetic diet. - hypoglycemia resolved.  3. History of alcohol use/abuse - B 12 levels within normal limits - continue ciwa protocol  4. Questionable new onset atrial flutter - no red flags reported on telemetry - may have been artifact as patient is currently in sinus rhythm. - Heart rate has been bradycardic with normal blood pressure on last check of 116/66.   Code Status: full Family Communication:  Discussed with patient and his sister. Disposition Plan: Pending further work up and psychiatry evaluation and recommendations.   Consultants:  Psychiatry  Procedures:  Echocardiogram  Carotid dopplers  Antibiotics:  None  HPI/Subjective: No new complaints reported. No acute issues reported overnight.   Objective: Filed Vitals:   01/01/13 1618 01/01/13 2112 01/02/13 0500 01/02/13 0623  BP: 173/85 122/93  116/66  Pulse: 86 55  55  Temp: 97.8 F (36.6 C) 97.7 F (36.5 C)  97.5 F (36.4 C)  TempSrc: Oral Oral  Oral  Resp: 18 16  16   Height:   5\' 9"  (1.753 m)   Weight:   63.594 kg (140 lb 3.2 oz) 61.281 kg (135 lb 1.6 oz)  SpO2: 97% 99%  95%    Intake/Output Summary (Last 24 hours) at 01/02/13 1115 Last data filed at 01/02/13 0400  Gross per 24 hour  Intake    375 ml  Output     200 ml  Net    175 ml   Filed Weights   01/02/13 0500 01/02/13 0623  Weight: 63.594 kg (140 lb 3.2 oz) 61.281 kg (135 lb 1.6 oz)    Exam:   General:  Pt in NAD, Alert and Awake  Cardiovascular: RRR, no MRG  Respiratory: CTA BL, no wheezes  Abdomen: soft, NT, ND  Musculoskeletal: no cyanosis or clubbing   Data Reviewed: Basic Metabolic Panel:  Recent Labs Lab 01/01/13 1311 01/02/13 0529  NA 138 140  K 3.5 4.1  CL 101 104  CO2  --  30  GLUCOSE 105* 105*  BUN 19 14  CREATININE 0.80 0.95  CALCIUM  --  8.5  MG  --  2.1   Liver Function Tests:  Recent Labs Lab 01/01/13 1435  AST 34  ALT 26  ALKPHOS 90  BILITOT 0.8  PROT 6.6  ALBUMIN 3.5   No results found for this basename: LIPASE, AMYLASE,  in the last 168 hours No results found for this basename: AMMONIA,  in the last 168 hours CBC:  Recent Labs Lab 01/01/13 1305 01/01/13 1311 01/02/13 0529  WBC 4.3  --  4.4  NEUTROABS 3.4  --   --   HGB 14.8 14.6 12.6*  HCT 40.7 43.0 36.1*  MCV 92.1  --  91.9  PLT 160  --  132*   Cardiac Enzymes:  Recent Labs Lab 01/01/13 1300  CKTOTAL 79   BNP (last 3 results) No results found for this basename: PROBNP,  in the last 8760 hours CBG:  Recent Labs Lab 01/01/13 1250 01/01/13 1540 01/01/13 1757 01/01/13 2109 01/02/13 0737  GLUCAP 67* 138* 114* 124* 103*    No results found for this or any previous visit (from the past 240 hour(s)).   Studies: Dg Chest 1 View  01/01/2013   *RADIOLOGY REPORT*  Clinical Data: Fall, weakness  CHEST - 1 VIEW  Comparison: 11/15/2005  Findings: Cardiomediastinal silhouette is stable.  No acute infiltrate or pleural effusion.  No pulmonary edema.  Degenerative changes are noted right AC joint.  IMPRESSION: No active disease.   Original Report Authenticated By: Natasha Mead, M.D.   Dg Hip Complete Right  01/01/2013   *RADIOLOGY REPORT*  Clinical Data: Right hip pain.  Falls.  Hip discomfort.  RIGHT HIP - COMPLETE 2+ VIEW   Comparison: None available.  Findings: Degenerative changes are present in the right hip and at the pubic symphysis.  There are degenerative changes in the SI joints and lower lumbar spine bilaterally.  No acute fracture or dislocation is evident.  IMPRESSION: Degenerative changes of the pelvis and right hip without evidence for acute fracture or dislocation.   Original Report Authenticated By: Marin Roberts, M.D.   Ct Head Wo Contrast  01/01/2013   *RADIOLOGY REPORT*  Clinical Data:  Hypoglycemia  CT HEAD WITHOUT CONTRAST CT MAXILLOFACIAL WITHOUT CONTRAST CT CERVICAL SPINE WITHOUT CONTRAST  Technique:  Multidetector CT imaging of the head, cervical spine, and maxillofacial structures were performed using the standard protocol without intravenous contrast. Multiplanar CT image reconstructions of the cervical spine and maxillofacial structures were also generated.  Comparison:   None  CT HEAD  Findings: The ventricular system is prominent, as are the cortical sulci diffusely thickened, consistent with atrophy.  Moderate small vessel ischemic change is noted throughout the periventricular white matter.  No hemorrhage, mass lesion, or acute infarction is seen.  On bone window images, no calvarial abnormality is noted. The paranasal sinuses that are visualized are clear.  IMPRESSION: Moderate atrophy and small vessel ischemic change.  No acute intracranial abnormality.  CT MAXILLOFACIAL  Findings:  No maxillofacial fracture is seen.  The orbital rims and zygomatic arches are intact.  No nasal bone fracture is seen. There is a probable retention cyst noted in the anterior left maxillary sinus, but no sinusitis is seen.  The nasal turbinates are normal in size and position.  IMPRESSION: No maxillofacial fracture is seen.  The sinuses are clear.  CT CERVICAL SPINE  Findings:   The cervical vertebrae are in normal alignment.  There is degenerative disc disease primarily at C4-5, C5-6, and C6-7 levels with loss of  disc space and spurring.  Posterior osteophytes at C5-6 and C6-7 levels somewhat narrow the central canal.  No acute fracture is seen.  The odontoid process is intact.  There are diffuse degenerative changes throughout the facet joints of the cervical spine.  Foraminal narrowing is noted at multiple levels as well.  The thyroid gland is unremarkable.  IMPRESSION:  1.  Diffuse degenerative disc disease with some foraminal narrowing and mild central canal narrowing at several levels.  2.  Osteopenia.  No acute fracture.   Original Report Authenticated By: Dwyane Dee, M.D.   Ct Cervical Spine Wo Contrast  01/01/2013   *RADIOLOGY REPORT*  Clinical Data:  Hypoglycemia  CT HEAD WITHOUT CONTRAST CT MAXILLOFACIAL WITHOUT CONTRAST CT CERVICAL SPINE WITHOUT CONTRAST  Technique:  Multidetector CT imaging of the head, cervical spine, and maxillofacial structures were performed using the standard protocol without intravenous contrast. Multiplanar CT image reconstructions of the cervical spine and maxillofacial structures were also generated.  Comparison:   None  CT HEAD  Findings: The ventricular system is prominent, as are the cortical sulci diffusely thickened, consistent with atrophy.  Moderate small vessel ischemic change is noted throughout the periventricular white matter.  No hemorrhage, mass lesion, or acute infarction is seen.  On bone window images, no calvarial abnormality is noted. The paranasal sinuses that are visualized are clear.  IMPRESSION: Moderate atrophy and small vessel ischemic change.  No acute intracranial abnormality.  CT MAXILLOFACIAL  Findings:  No maxillofacial fracture is seen.  The orbital rims and zygomatic arches are intact.  No nasal bone fracture is seen. There is a probable retention cyst noted in the anterior left maxillary sinus, but no sinusitis is seen.  The nasal turbinates are normal in size and position.  IMPRESSION: No maxillofacial fracture is seen.  The sinuses are clear.  CT  CERVICAL SPINE  Findings:   The cervical vertebrae are in normal alignment.  There is degenerative disc disease primarily at C4-5, C5-6, and C6-7 levels with loss of disc space and spurring.  Posterior osteophytes at C5-6 and C6-7 levels somewhat narrow the central canal.  No acute fracture is seen.  The odontoid process is intact.  There are diffuse degenerative changes throughout the facet joints of the cervical spine.  Foraminal narrowing is noted at multiple levels as well.  The thyroid gland is unremarkable.  IMPRESSION:  1.  Diffuse degenerative disc disease with some foraminal narrowing and mild central canal narrowing at several levels.  2.  Osteopenia.  No acute fracture.   Original Report Authenticated By: Dwyane Dee, M.D.   Mr Brain Wo Contrast  01/01/2013   *RADIOLOGY REPORT*  Clinical Data: Syncope.  Hyperglycemia.  Recent falls.  Diabetes and hypertension.  MRI HEAD WITHOUT CONTRAST  Technique:  Multiplanar, multiecho pulse sequences of the brain and surrounding structures were obtained according to standard protocol without intravenous contrast.  Comparison: 01/01/2013 head CT.  03/16/2012 brain MR.  Findings: No acute infarct.  Right lateral orbital subcutaneous hematoma.  No intracranial hemorrhage.  Prominent small vessel disease type changes.  Global atrophy.  Ventricular prominence possibly related to atrophy although superimposed mild hydrocephalus is a possibility.  Overall appearance is without significant change.  No intracranial mass lesion detected on this unenhanced exam.  Partially empty sella felt to be incidental finding.  Cervical medullary junction, pineal region and orbital structures unremarkable.  Major intracranial vascular structures are patent.  Sub centimeter nonspecific left parotid lesion without change.  IMPRESSION: No acute infarct.  Right lateral orbital subcutaneous hematoma.  No intracranial hemorrhage.  Prominent small vessel disease type changes.  Global atrophy.   Ventricular prominence possibly related to atrophy although superimposed mild hydrocephalus not excluded.  Overall appearance without change.   Original Report Authenticated By: Lacy Duverney, M.D.   Ct Maxillofacial Wo Cm  01/01/2013   *RADIOLOGY REPORT*  Clinical Data:  Hypoglycemia  CT HEAD WITHOUT CONTRAST CT MAXILLOFACIAL WITHOUT CONTRAST CT CERVICAL SPINE WITHOUT CONTRAST  Technique:  Multidetector CT imaging of the head, cervical spine, and maxillofacial structures were performed using the standard protocol without intravenous contrast. Multiplanar CT image reconstructions of the cervical spine and maxillofacial structures were also generated.  Comparison:   None  CT HEAD  Findings: The ventricular system is prominent,  as are the cortical sulci diffusely thickened, consistent with atrophy.  Moderate small vessel ischemic change is noted throughout the periventricular white matter.  No hemorrhage, mass lesion, or acute infarction is seen.  On bone window images, no calvarial abnormality is noted. The paranasal sinuses that are visualized are clear.  IMPRESSION: Moderate atrophy and small vessel ischemic change.  No acute intracranial abnormality.  CT MAXILLOFACIAL  Findings:  No maxillofacial fracture is seen.  The orbital rims and zygomatic arches are intact.  No nasal bone fracture is seen. There is a probable retention cyst noted in the anterior left maxillary sinus, but no sinusitis is seen.  The nasal turbinates are normal in size and position.  IMPRESSION: No maxillofacial fracture is seen.  The sinuses are clear.  CT CERVICAL SPINE  Findings:   The cervical vertebrae are in normal alignment.  There is degenerative disc disease primarily at C4-5, C5-6, and C6-7 levels with loss of disc space and spurring.  Posterior osteophytes at C5-6 and C6-7 levels somewhat narrow the central canal.  No acute fracture is seen.  The odontoid process is intact.  There are diffuse degenerative changes throughout the  facet joints of the cervical spine.  Foraminal narrowing is noted at multiple levels as well.  The thyroid gland is unremarkable.  IMPRESSION:  1.  Diffuse degenerative disc disease with some foraminal narrowing and mild central canal narrowing at several levels.  2.  Osteopenia.  No acute fracture.   Original Report Authenticated By: Dwyane Dee, M.D.    Scheduled Meds: . folic acid  1 mg Oral Daily  . metoprolol tartrate  50 mg Oral BID  . multivitamin with minerals  1 tablet Oral Daily  . sodium chloride  3 mL Intravenous Q12H  . thiamine  100 mg Oral Daily   Or  . thiamine  100 mg Intravenous Daily   Continuous Infusions: . dextrose 5 % and 0.45 % NaCl with KCl 10 mEq/L 75 mL/hr at 01/01/13 1829    Principal Problem:   Syncope Active Problems:   Diabetes mellitus without complication   Hypertension   Hypoglycemia   Delirium   Dementia    Time spent: > 35 minutes    Penny Pia  [facility] Pager [phone] If 7PM-7AM, please contact night-coverage at [email or web address], password [facility] 01/02/2013, 11:15 AM  LOS: 1 day

## 2013-01-02 NOTE — Evaluation (Signed)
Physical Therapy Evaluation [name] MRN: [id number] DOB: Nov 24, 1931 Today's Date: 01/02/2013 Time: 4098-1191 PT Time Calculation (min): 15 min  PT Assessment / Plan / Recommendation Clinical Impression  Pt is a 79 year old male admitted for delirium vs dementia after neighbors reported that patient was found lying in his front yard and was apparently locked out of his house for the past few days. Pt reports that he has been lightheaded and falling over and over again for the last several days.  Pt would benefit from acute PT services in order to improve independence with transfers and ambulation by increasing activity tolerance and improving overall strength to prepare for d/c to next venue.  Recommend ST-SNF as pt would be unsafe home alone due to hx of falls, syncope, and current weakness requiring assist for mobility.    PT Assessment  Patient needs continued PT services    Follow Up Recommendations  Supervision/Assistance - 24 hour;SNF    Does the patient have the potential to tolerate intense rehabilitation      Barriers to Discharge        Equipment Recommendations  Rolling walker with 5" wheels    Recommendations for Other Services     Frequency Min 3X/week    Precautions / Restrictions Precautions Precautions: Fall Restrictions Weight Bearing Restrictions: No   Pertinent Vitals/Pain Orthostatics in docflowsheets Pt did have significant drop in BP upon sitting.      Mobility  Bed Mobility Bed Mobility: Supine to Sit Supine to Sit: 3: Mod assist Details for Bed Mobility Assistance: assist for trunk, pt dizzy upon sitting EOB Transfers Transfers: Sit to Stand;Stand to Sit;Stand Pivot Transfers Sit to Stand: 3: Mod assist;From bed;With upper extremity assist Stand to Sit: 3: Mod assist;To bed;To chair/3-in-1;With upper extremity assist Stand Pivot Transfers: 3: Mod assist Details for Transfer Assistance: orthostatics taken with assist  of nsg tech (+2 for safety), pt required assist due to weakness and dizziness, 2 HHA required, sit to stands twice due to pt urinating on floor (cleaned wet floor and nsg tech changed gown), pt assisted to recliner however did not ambulate due to weakness and drop in BP upon sitting Ambulation/Gait Ambulation/Gait Assistance: Not tested (comment)    Exercises     PT Diagnosis: Difficulty walking;Generalized weakness  PT Problem List: Decreased strength;Decreased activity tolerance;Decreased balance;Decreased mobility;Decreased knowledge of precautions;Decreased safety awareness;Decreased knowledge of use of DME;Decreased cognition PT Treatment Interventions: DME instruction;Gait training;Functional mobility training;Therapeutic activities;Therapeutic exercise;Patient/family education;Balance training;Neuromuscular re-education   PT Goals Acute Rehab PT Goals PT Goal Formulation: With patient Time For Goal Achievement: 01/16/13 Potential to Achieve Goals: Good Pt will go Supine/Side to Sit: with supervision PT Goal: Supine/Side to Sit - Progress: Goal set today Pt will go Sit to Stand: with supervision PT Goal: Sit to Stand - Progress: Goal set today Pt will go Stand to Sit: with supervision PT Goal: Stand to Sit - Progress: Goal set today Pt will Ambulate: 51 - 150 feet;with supervision;with least restrictive assistive device PT Goal: Ambulate - Progress: Goal set today  Visit Information  Last PT Received On: 01/02/13 Assistance Needed: +2    Subjective Data  Subjective: Pt able to state he was in hospital.   Prior Functioning  Home Living Lives With: Alone Type of Home: House Home Adaptive Equipment: None Prior Function Level of Independence: Independent Communication Communication: No difficulties    Cognition  Cognition Arousal/Alertness: Awake/alert Behavior During Therapy: Flat affect Overall Cognitive Status: No family/caregiver present  to determine baseline  cognitive functioning (RN reports confused and not orientated this morning)    Extremity/Trunk Assessment Right Lower Extremity Assessment RLE ROM/Strength/Tone: Deficits RLE ROM/Strength/Tone Deficits: functional weakness observed, grossly at least 2+/5 Left Lower Extremity Assessment LLE ROM/Strength/Tone: Deficits LLE ROM/Strength/Tone Deficits: functional weakness observed, grossly at least 2+/5   Balance    End of Session PT - End of Session Activity Tolerance: Patient limited by fatigue Patient left: in chair;with call bell/phone within reach;with chair alarm set  GP     Braydee Shimkus,KATHrine E 01/02/2013, 12:43 PM Zenovia Jarred, PT, DPT 01/02/2013 Pager: [phone]

## 2013-01-02 NOTE — Consult Note (Signed)
Reason for Consult:Capacity evaluation and history of dementia Referring Physician: Dr. Eyvonne Left is an 79 y.o. male.  HPI: Patient was seen and chart reviewed.  Patient is a poor historian and has more difficulty stopping information because of significant cognitive deficits especially memory. Patient still believes that he lives with his mother and father and his father this head out of the house, mother is helping around the house. Patient has some evidence that he has a problem with his memory. Patient was able to provide complete name, DOB, place he lives. Patient has lost rest of the orientation concentration and memory especially immediate and delayed. Patient has decreased fund of knowledge. Patient believes that he has been staying in a place where he get rehabilitation. Patient could not name his medical conditions and his treatment needs.  Mental Status Examination: Patient appeared as per his stated age, disheveled unshaven long beard, poor hygiene and multiple lacerations on his face with dried blood. Patient has poor eye contact. Patient has  fine  mood and his affect was constricted/flat . He has normal rate, rhythm, and  Low volume of speech. His thought process is linear and goal directed. Patient has denied suicidal, homicidal ideations, intentions or plans.  He has significant cognitive deficits including memory.Patient has no evidence of auditory or visual hallucinations, delusions, and paranoia. Patient has  poor insight,  judgment and impulse control.  Past Medical History  Diagnosis Date  . Hypertension   . Diabetes mellitus without complication   . Arthritis   . Dementia     Past Surgical History  Procedure Laterality Date  . No past surgeries      History reviewed. No pertinent family history.  Social History:  reports that he has never smoked. He has never used smokeless tobacco. He reports that  drinks alcohol. He reports that he does not use illicit  drugs.  Allergies:  Allergies  Allergen Reactions  . Augmentin (Amoxicillin-Pot Clavulanate) Other (See Comments)    Reaction Unknown, from Doctor's office  . Zestril (Lisinopril) Other (See Comments)    Reaction Unknown, from Doctor's office    Medications: I have reviewed the patient's current medications.  Results for orders placed during the hospital encounter of 01/01/13 (from the past 48 hour(s))  GLUCOSE, CAPILLARY     Status: Abnormal   Collection Time    01/01/13 12:50 PM      Result Value Range   Glucose-Capillary 67 (*) 70 - 99 mg/dL  CK     Status: None   Collection Time    01/01/13  1:00 PM      Result Value Range   Total CK 79  7 - 232 U/L  HEMOGLOBIN A1C     Status: None   Collection Time    01/01/13  1:00 PM      Result Value Range   Hemoglobin A1C 5.2  <5.7 %   Comment: (NOTE)                                                                               According to the ADA Clinical Practice Recommendations for 2011, when     HbA1c is used as  a screening test:      >=6.5%   Diagnostic of Diabetes Mellitus               (if abnormal result is confirmed)     5.7-6.4%   Increased risk of developing Diabetes Mellitus     References:Diagnosis and Classification of Diabetes Mellitus,Diabetes     Care,2011,34(Suppl 1):S62-S69 and Standards of Medical Care in             Diabetes - 2011,Diabetes Care,2011,34 (Suppl 1):S11-S61.     CORRECTED ON 06/11 AT 1610: PREVIOUSLY REPORTED AS 5.2 Reference range: <5.7   Mean Plasma Glucose 103  <117 mg/dL  TSH     Status: None   Collection Time    01/01/13  1:00 PM      Result Value Range   TSH 0.770  0.350 - 4.500 uIU/mL  CBC WITH DIFFERENTIAL     Status: Abnormal   Collection Time    01/01/13  1:05 PM      Result Value Range   WBC 4.3  4.0 - 10.5 K/uL   RBC 4.42  4.22 - 5.81 MIL/uL   Hemoglobin 14.8  13.0 - 17.0 g/dL   HCT 96.0  45.4 - 09.8 %   MCV 92.1  78.0 - 100.0 fL   MCH 33.5  26.0 - 34.0 pg   MCHC 36.4 (*)  30.0 - 36.0 g/dL   RDW 11.9  14.7 - 82.9 %   Platelets 160  150 - 400 K/uL   Neutrophils Relative % 79 (*) 43 - 77 %   Neutro Abs 3.4  1.7 - 7.7 K/uL   Lymphocytes Relative 15  12 - 46 %   Lymphs Abs 0.6 (*) 0.7 - 4.0 K/uL   Monocytes Relative 6  3 - 12 %   Monocytes Absolute 0.3  0.1 - 1.0 K/uL   Eosinophils Relative 0  0 - 5 %   Eosinophils Absolute 0.0  0.0 - 0.7 K/uL   Basophils Relative 0  0 - 1 %   Basophils Absolute 0.0  0.0 - 0.1 K/uL  POCT I-STAT, CHEM 8     Status: Abnormal   Collection Time    01/01/13  1:11 PM      Result Value Range   Sodium 138  135 - 145 mEq/L   Potassium 3.5  3.5 - 5.1 mEq/L   Chloride 101  96 - 112 mEq/L   BUN 19  6 - 23 mg/dL   Creatinine, Ser 5.62  0.50 - 1.35 mg/dL   Glucose, Bld 130 (*) 70 - 99 mg/dL   Calcium, Ion 8.65  7.84 - 1.30 mmol/L   TCO2 23  0 - 100 mmol/L   Hemoglobin 14.6  13.0 - 17.0 g/dL   HCT 69.6  29.5 - 28.4 %  HEPATIC FUNCTION PANEL     Status: None   Collection Time    01/01/13  2:35 PM      Result Value Range   Total Protein 6.6  6.0 - 8.3 g/dL   Albumin 3.5  3.5 - 5.2 g/dL   AST 34  0 - 37 U/L   ALT 26  0 - 53 U/L   Alkaline Phosphatase 90  39 - 117 U/L   Total Bilirubin 0.8  0.3 - 1.2 mg/dL   Bilirubin, Direct 0.3  0.0 - 0.3 mg/dL   Indirect Bilirubin 0.5  0.3 - 0.9 mg/dL  ETHANOL     Status: None   Collection  Time    01/01/13  2:35 PM      Result Value Range   Alcohol, Ethyl (B) <11  0 - 11 mg/dL   Comment:            LOWEST DETECTABLE LIMIT FOR     SERUM ALCOHOL IS 11 mg/dL     FOR MEDICAL PURPOSES ONLY  GLUCOSE, CAPILLARY     Status: Abnormal   Collection Time    01/01/13  3:40 PM      Result Value Range   Glucose-Capillary 138 (*) 70 - 99 mg/dL  VITAMIN W09     Status: Abnormal   Collection Time    01/01/13  4:00 PM      Result Value Range   Vitamin B-12 1283 (*) 211 - 911 pg/mL  GLUCOSE, CAPILLARY     Status: Abnormal   Collection Time    01/01/13  5:57 PM      Result Value Range    Glucose-Capillary 114 (*) 70 - 99 mg/dL  URINALYSIS, ROUTINE W REFLEX MICROSCOPIC     Status: Abnormal   Collection Time    01/01/13  9:03 PM      Result Value Range   Color, Urine YELLOW  YELLOW   APPearance CLEAR  CLEAR   Specific Gravity, Urine 1.018  1.005 - 1.030   pH 6.0  5.0 - 8.0   Glucose, UA NEGATIVE  NEGATIVE mg/dL   Hgb urine dipstick NEGATIVE  NEGATIVE   Bilirubin Urine NEGATIVE  NEGATIVE   Ketones, ur 40 (*) NEGATIVE mg/dL   Protein, ur NEGATIVE  NEGATIVE mg/dL   Urobilinogen, UA 1.0  0.0 - 1.0 mg/dL   Nitrite NEGATIVE  NEGATIVE   Leukocytes, UA NEGATIVE  NEGATIVE   Comment: MICROSCOPIC NOT DONE ON URINES WITH NEGATIVE PROTEIN, BLOOD, LEUKOCYTES, NITRITE, OR GLUCOSE <1000 mg/dL.  URINE RAPID DRUG SCREEN (HOSP PERFORMED)     Status: None   Collection Time    01/01/13  9:04 PM      Result Value Range   Opiates NONE DETECTED  NONE DETECTED   Cocaine NONE DETECTED  NONE DETECTED   Benzodiazepines NONE DETECTED  NONE DETECTED   Amphetamines NONE DETECTED  NONE DETECTED   Tetrahydrocannabinol NONE DETECTED  NONE DETECTED   Barbiturates NONE DETECTED  NONE DETECTED   Comment:            DRUG SCREEN FOR MEDICAL PURPOSES     ONLY.  IF CONFIRMATION IS NEEDED     FOR ANY PURPOSE, NOTIFY LAB     WITHIN 5 DAYS.                LOWEST DETECTABLE LIMITS     FOR URINE DRUG SCREEN     Drug Class       Cutoff (ng/mL)     Amphetamine      1000     Barbiturate      200     Benzodiazepine   200     Tricyclics       300     Opiates          300     Cocaine          300     THC              50  GLUCOSE, CAPILLARY     Status: Abnormal   Collection Time    01/01/13  9:09 PM      Result Value  Range   Glucose-Capillary 124 (*) 70 - 99 mg/dL   Comment 1 Documented in Chart     Comment 2 Notify RN    BASIC METABOLIC PANEL     Status: Abnormal   Collection Time    01/02/13  5:29 AM      Result Value Range   Sodium 140  135 - 145 mEq/L   Potassium 4.1  3.5 - 5.1 mEq/L    Chloride 104  96 - 112 mEq/L   CO2 30  19 - 32 mEq/L   Glucose, Bld 105 (*) 70 - 99 mg/dL   BUN 14  6 - 23 mg/dL   Creatinine, Ser 1.61  0.50 - 1.35 mg/dL   Calcium 8.5  8.4 - 09.6 mg/dL   GFR calc non Af Amer 78 (*) >90 mL/min   GFR calc Af Amer >90  >90 mL/min   Comment:            The eGFR has been calculated     using the CKD EPI equation.     This calculation has not been     validated in all clinical     situations.     eGFR's persistently     <90 mL/min signify     possible Chronic Kidney Disease.  CBC     Status: Abnormal   Collection Time    01/02/13  5:29 AM      Result Value Range   WBC 4.4  4.0 - 10.5 K/uL   RBC 3.93 (*) 4.22 - 5.81 MIL/uL   Hemoglobin 12.6 (*) 13.0 - 17.0 g/dL   Comment: PREVIOUS RESULT FROM AN ISTAT.   HCT 36.1 (*) 39.0 - 52.0 %   MCV 91.9  78.0 - 100.0 fL   MCH 32.1  26.0 - 34.0 pg   MCHC 34.9  30.0 - 36.0 g/dL   RDW 04.5  40.9 - 81.1 %   Platelets 132 (*) 150 - 400 K/uL  MAGNESIUM     Status: None   Collection Time    01/02/13  5:29 AM      Result Value Range   Magnesium 2.1  1.5 - 2.5 mg/dL  GLUCOSE, CAPILLARY     Status: Abnormal   Collection Time    01/02/13  7:37 AM      Result Value Range   Glucose-Capillary 103 (*) 70 - 99 mg/dL  GLUCOSE, CAPILLARY     Status: Abnormal   Collection Time    01/02/13 12:20 PM      Result Value Range   Glucose-Capillary 115 (*) 70 - 99 mg/dL    Dg Chest 1 View  04/07/7828   *RADIOLOGY REPORT*  Clinical Data: Fall, weakness  CHEST - 1 VIEW  Comparison: 11/15/2005  Findings: Cardiomediastinal silhouette is stable.  No acute infiltrate or pleural effusion.  No pulmonary edema.  Degenerative changes are noted right AC joint.  IMPRESSION: No active disease.   Original Report Authenticated By: Natasha Mead, M.D.   Dg Hip Complete Right  01/01/2013   *RADIOLOGY REPORT*  Clinical Data: Right hip pain.  Falls.  Hip discomfort.  RIGHT HIP - COMPLETE 2+ VIEW  Comparison: None available.  Findings: Degenerative  changes are present in the right hip and at the pubic symphysis.  There are degenerative changes in the SI joints and lower lumbar spine bilaterally.  No acute fracture or dislocation is evident.  IMPRESSION: Degenerative changes of the pelvis and right hip without evidence for acute fracture  or dislocation.   Original Report Authenticated By: Marin Roberts, M.D.   Ct Head Wo Contrast  01/01/2013   *RADIOLOGY REPORT*  Clinical Data:  Hypoglycemia  CT HEAD WITHOUT CONTRAST CT MAXILLOFACIAL WITHOUT CONTRAST CT CERVICAL SPINE WITHOUT CONTRAST  Technique:  Multidetector CT imaging of the head, cervical spine, and maxillofacial structures were performed using the standard protocol without intravenous contrast. Multiplanar CT image reconstructions of the cervical spine and maxillofacial structures were also generated.  Comparison:   None  CT HEAD  Findings: The ventricular system is prominent, as are the cortical sulci diffusely thickened, consistent with atrophy.  Moderate small vessel ischemic change is noted throughout the periventricular white matter.  No hemorrhage, mass lesion, or acute infarction is seen.  On bone window images, no calvarial abnormality is noted. The paranasal sinuses that are visualized are clear.  IMPRESSION: Moderate atrophy and small vessel ischemic change.  No acute intracranial abnormality.  CT MAXILLOFACIAL  Findings:  No maxillofacial fracture is seen.  The orbital rims and zygomatic arches are intact.  No nasal bone fracture is seen. There is a probable retention cyst noted in the anterior left maxillary sinus, but no sinusitis is seen.  The nasal turbinates are normal in size and position.  IMPRESSION: No maxillofacial fracture is seen.  The sinuses are clear.  CT CERVICAL SPINE  Findings:   The cervical vertebrae are in normal alignment.  There is degenerative disc disease primarily at C4-5, C5-6, and C6-7 levels with loss of disc space and spurring.  Posterior osteophytes at  C5-6 and C6-7 levels somewhat narrow the central canal.  No acute fracture is seen.  The odontoid process is intact.  There are diffuse degenerative changes throughout the facet joints of the cervical spine.  Foraminal narrowing is noted at multiple levels as well.  The thyroid gland is unremarkable.  IMPRESSION:  1.  Diffuse degenerative disc disease with some foraminal narrowing and mild central canal narrowing at several levels.  2.  Osteopenia.  No acute fracture.   Original Report Authenticated By: Dwyane Dee, M.D.   Ct Cervical Spine Wo Contrast  01/01/2013   *RADIOLOGY REPORT*  Clinical Data:  Hypoglycemia  CT HEAD WITHOUT CONTRAST CT MAXILLOFACIAL WITHOUT CONTRAST CT CERVICAL SPINE WITHOUT CONTRAST  Technique:  Multidetector CT imaging of the head, cervical spine, and maxillofacial structures were performed using the standard protocol without intravenous contrast. Multiplanar CT image reconstructions of the cervical spine and maxillofacial structures were also generated.  Comparison:   None  CT HEAD  Findings: The ventricular system is prominent, as are the cortical sulci diffusely thickened, consistent with atrophy.  Moderate small vessel ischemic change is noted throughout the periventricular white matter.  No hemorrhage, mass lesion, or acute infarction is seen.  On bone window images, no calvarial abnormality is noted. The paranasal sinuses that are visualized are clear.  IMPRESSION: Moderate atrophy and small vessel ischemic change.  No acute intracranial abnormality.  CT MAXILLOFACIAL  Findings:  No maxillofacial fracture is seen.  The orbital rims and zygomatic arches are intact.  No nasal bone fracture is seen. There is a probable retention cyst noted in the anterior left maxillary sinus, but no sinusitis is seen.  The nasal turbinates are normal in size and position.  IMPRESSION: No maxillofacial fracture is seen.  The sinuses are clear.  CT CERVICAL SPINE  Findings:   The cervical vertebrae are  in normal alignment.  There is degenerative disc disease primarily at C4-5, C5-6, and C6-7 levels  with loss of disc space and spurring.  Posterior osteophytes at C5-6 and C6-7 levels somewhat narrow the central canal.  No acute fracture is seen.  The odontoid process is intact.  There are diffuse degenerative changes throughout the facet joints of the cervical spine.  Foraminal narrowing is noted at multiple levels as well.  The thyroid gland is unremarkable.  IMPRESSION:  1.  Diffuse degenerative disc disease with some foraminal narrowing and mild central canal narrowing at several levels.  2.  Osteopenia.  No acute fracture.   Original Report Authenticated By: Dwyane Dee, M.D.   Mr Brain Wo Contrast  01/01/2013   *RADIOLOGY REPORT*  Clinical Data: Syncope.  Hyperglycemia.  Recent falls.  Diabetes and hypertension.  MRI HEAD WITHOUT CONTRAST  Technique:  Multiplanar, multiecho pulse sequences of the brain and surrounding structures were obtained according to standard protocol without intravenous contrast.  Comparison: 01/01/2013 head CT.  03/16/2012 brain MR.  Findings: No acute infarct.  Right lateral orbital subcutaneous hematoma.  No intracranial hemorrhage.  Prominent small vessel disease type changes.  Global atrophy.  Ventricular prominence possibly related to atrophy although superimposed mild hydrocephalus is a possibility.  Overall appearance is without significant change.  No intracranial mass lesion detected on this unenhanced exam.  Partially empty sella felt to be incidental finding.  Cervical medullary junction, pineal region and orbital structures unremarkable.  Major intracranial vascular structures are patent.  Sub centimeter nonspecific left parotid lesion without change.  IMPRESSION: No acute infarct.  Right lateral orbital subcutaneous hematoma.  No intracranial hemorrhage.  Prominent small vessel disease type changes.  Global atrophy.  Ventricular prominence possibly related to atrophy  although superimposed mild hydrocephalus not excluded.  Overall appearance without change.   Original Report Authenticated By: Lacy Duverney, M.D.   Ct Maxillofacial Wo Cm  01/01/2013   *RADIOLOGY REPORT*  Clinical Data:  Hypoglycemia  CT HEAD WITHOUT CONTRAST CT MAXILLOFACIAL WITHOUT CONTRAST CT CERVICAL SPINE WITHOUT CONTRAST  Technique:  Multidetector CT imaging of the head, cervical spine, and maxillofacial structures were performed using the standard protocol without intravenous contrast. Multiplanar CT image reconstructions of the cervical spine and maxillofacial structures were also generated.  Comparison:   None  CT HEAD  Findings: The ventricular system is prominent, as are the cortical sulci diffusely thickened, consistent with atrophy.  Moderate small vessel ischemic change is noted throughout the periventricular white matter.  No hemorrhage, mass lesion, or acute infarction is seen.  On bone window images, no calvarial abnormality is noted. The paranasal sinuses that are visualized are clear.  IMPRESSION: Moderate atrophy and small vessel ischemic change.  No acute intracranial abnormality.  CT MAXILLOFACIAL  Findings:  No maxillofacial fracture is seen.  The orbital rims and zygomatic arches are intact.  No nasal bone fracture is seen. There is a probable retention cyst noted in the anterior left maxillary sinus, but no sinusitis is seen.  The nasal turbinates are normal in size and position.  IMPRESSION: No maxillofacial fracture is seen.  The sinuses are clear.  CT CERVICAL SPINE  Findings:   The cervical vertebrae are in normal alignment.  There is degenerative disc disease primarily at C4-5, C5-6, and C6-7 levels with loss of disc space and spurring.  Posterior osteophytes at C5-6 and C6-7 levels somewhat narrow the central canal.  No acute fracture is seen.  The odontoid process is intact.  There are diffuse degenerative changes throughout the facet joints of the cervical spine.  Foraminal  narrowing is noted  at multiple levels as well.  The thyroid gland is unremarkable.  IMPRESSION:  1.  Diffuse degenerative disc disease with some foraminal narrowing and mild central canal narrowing at several levels.  2.  Osteopenia.  No acute fracture.   Original Report Authenticated By: Dwyane Dee, M.D.    Positive for learning difficulty, sleep disturbance and severe memory deficits Blood pressure 159/80, pulse 63, temperature 97.5 F (36.4 C), temperature source Oral, resp. rate 16, height 5\' 9"  (1.753 m), weight 61.281 kg (135 lb 1.6 oz), SpO2 95.00%.   Assessment/Plan: Dementia NOS  1. Patient does not meet criteria for a Capacity to make healthcare decisions or living arrangements.  2. Please refer to this psych social worker regarding HCPOA and placement needsHe  3. Start Aricept 10 mg QHS.for dementia 4. Appreciate psychiatric consultation     Korissa Horsford,JANARDHAHA R. 01/02/2013, 12:42 PM

## 2013-01-02 NOTE — Progress Notes (Signed)
  Echocardiogram 2D Echocardiogram has been performed.  Cathie Beams 01/02/2013, 9:36 AM

## 2013-01-02 NOTE — Progress Notes (Signed)
VASCULAR LAB PRELIMINARY  PRELIMINARY  PRELIMINARY  PRELIMINARY  Carotid duplex completed.    Preliminary report:  Bilateral:  Less than 39% ICA stenosis.  Vertebral artery flow is antegrade.     Darrold Bezek, RVS 01/02/2013, 10:20 AM
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
## 2013-01-03 DIAGNOSIS — F0390 Unspecified dementia without behavioral disturbance: Secondary | ICD-10-CM

## 2013-01-03 LAB — MAGNESIUM: Magnesium: 1.9 mg/dL (ref 1.5–2.5)

## 2013-01-03 LAB — GLUCOSE, CAPILLARY
Glucose-Capillary: 98 mg/dL (ref 70–99)
Glucose-Capillary: 160 mg/dL — ABNORMAL HIGH (ref 70–99)
Glucose-Capillary: 138 mg/dL — ABNORMAL HIGH (ref 70–99)
Glucose-Capillary: 190 mg/dL — ABNORMAL HIGH (ref 70–99)

## 2013-01-03 NOTE — Progress Notes (Signed)
[facility] PROGRESS NOTE  Johnny Hayden:[id number] DOB: 05-16-1932 DOA: 01/01/2013 PCP: No primary provider on file.  Assessment/Plan: 1. Delirium vs dementia - Psychiatry has deemed patient unable to make his own medical decisions.  At this point awaiting plans from social worker for placement. - MRI showed no acute infarct but does show global atrophy - Vit b12 is elevated, TSH wnl, hgba1c wnl,  Folate wnl, and rpr is non reactive - UDS was negative  2. DM - hemoglobin a1c within normal limits at 5.2  - would plan on holding metformin and treating with diabetic diet. - hypoglycemia resolved.  3. History of alcohol use/abuse - B 12 levels within normal limits - continue ciwa protocol  4. Questionable new onset atrial flutter - no red flags reported on telemetry - may have been artifact as patient is currently in sinus rhythm. - Heart rate has been bradycardic but hypertensive.  Decreased B blocker    Code Status: full Family Communication:  Discussed with patient and his sister. Disposition Plan: Pending further work up and psychiatry evaluation and recommendations.   Consultants:  Psychiatry  Procedures:  Echocardiogram  Carotid dopplers  Antibiotics:  None  HPI/Subjective: Patient reportedly was confused overnight and required restraints.  During my exam patient was pleasant and answering questions appropriately.  As such restraints discontinued.  Objective: Filed Vitals:   01/02/13 1146 01/02/13 1307 01/02/13 2104 01/03/13 0500  BP: 159/80 127/77 152/78 158/92  Pulse: 63 45 60 61  Temp:  97.6 F (36.4 C) 98.1 F (36.7 C) 97.2 F (36.2 C)  TempSrc:  Oral Oral Oral  Resp:  16 16 16   Height:      Weight:    61.236 kg (135 lb)  SpO2:  93% 100% 100%    Intake/Output Summary (Last 24 hours) at 01/03/13 1307 Last data filed at 01/03/13 1137  Gross per 24 hour  Intake   1345 ml  Output   1325 ml  Net     20 ml   Filed Weights   01/02/13  0500 01/02/13 0623 01/03/13 0500  Weight: 63.594 kg (140 lb 3.2 oz) 61.281 kg (135 lb 1.6 oz) 61.236 kg (135 lb)    Exam:   General:  Pt in NAD, Alert and Awake  Cardiovascular: RRR, no MRG  Respiratory: CTA BL, no wheezes  Abdomen: soft, NT, ND  Musculoskeletal: no cyanosis or clubbing   Data Reviewed: Basic Metabolic Panel:  Recent Labs Lab 01/01/13 1311 01/02/13 0529 01/03/13 0525  NA 138 140  --   K 3.5 4.1  --   CL 101 104  --   CO2  --  30  --   GLUCOSE 105* 105*  --   BUN 19 14  --   CREATININE 0.80 0.95  --   CALCIUM  --  8.5  --   MG  --  2.1 1.9   Liver Function Tests:  Recent Labs Lab 01/01/13 1435  AST 34  ALT 26  ALKPHOS 90  BILITOT 0.8  PROT 6.6  ALBUMIN 3.5   No results found for this basename: LIPASE, AMYLASE,  in the last 168 hours No results found for this basename: AMMONIA,  in the last 168 hours CBC:  Recent Labs Lab 01/01/13 1305 01/01/13 1311 01/02/13 0529  WBC 4.3  --  4.4  NEUTROABS 3.4  --   --   HGB 14.8 14.6 12.6*  HCT 40.7 43.0 36.1*  MCV 92.1  --  91.9  PLT  160  --  132*   Cardiac Enzymes:  Recent Labs Lab 01/01/13 1300  CKTOTAL 79   BNP (last 3 results) No results found for this basename: PROBNP,  in the last 8760 hours CBG:  Recent Labs Lab 01/02/13 1220 01/02/13 1636 01/02/13 2101 01/03/13 0757 01/03/13 1144  GLUCAP 115* 110* 138* 98 160*    Recent Results (from the past 240 hour(s))  URINE CULTURE     Status: None   Collection Time    01/01/13  9:04 PM      Result Value Range Status   Specimen Description URINE, CLEAN CATCH   Final   Special Requests NONE   Final   Culture  Setup Time 01/02/2013 03:03   Final   Colony Count PENDING   Incomplete   Culture Culture reincubated for better growth   Final   Report Status PENDING   Incomplete     Studies: Dg Chest 1 View  01/01/2013   *RADIOLOGY REPORT*  Clinical Data: Fall, weakness  CHEST - 1 VIEW  Comparison: 11/15/2005  Findings:  Cardiomediastinal silhouette is stable.  No acute infiltrate or pleural effusion.  No pulmonary edema.  Degenerative changes are noted right AC joint.  IMPRESSION: No active disease.   Original Report Authenticated By: Natasha Mead, M.D.   Dg Hip Complete Right  01/01/2013   *RADIOLOGY REPORT*  Clinical Data: Right hip pain.  Falls.  Hip discomfort.  RIGHT HIP - COMPLETE 2+ VIEW  Comparison: None available.  Findings: Degenerative changes are present in the right hip and at the pubic symphysis.  There are degenerative changes in the SI joints and lower lumbar spine bilaterally.  No acute fracture or dislocation is evident.  IMPRESSION: Degenerative changes of the pelvis and right hip without evidence for acute fracture or dislocation.   Original Report Authenticated By: Marin Roberts, M.D.   Ct Head Wo Contrast  01/01/2013   *RADIOLOGY REPORT*  Clinical Data:  Hypoglycemia  CT HEAD WITHOUT CONTRAST CT MAXILLOFACIAL WITHOUT CONTRAST CT CERVICAL SPINE WITHOUT CONTRAST  Technique:  Multidetector CT imaging of the head, cervical spine, and maxillofacial structures were performed using the standard protocol without intravenous contrast. Multiplanar CT image reconstructions of the cervical spine and maxillofacial structures were also generated.  Comparison:   None  CT HEAD  Findings: The ventricular system is prominent, as are the cortical sulci diffusely thickened, consistent with atrophy.  Moderate small vessel ischemic change is noted throughout the periventricular white matter.  No hemorrhage, mass lesion, or acute infarction is seen.  On bone window images, no calvarial abnormality is noted. The paranasal sinuses that are visualized are clear.  IMPRESSION: Moderate atrophy and small vessel ischemic change.  No acute intracranial abnormality.  CT MAXILLOFACIAL  Findings:  No maxillofacial fracture is seen.  The orbital rims and zygomatic arches are intact.  No nasal bone fracture is seen. There is a probable  retention cyst noted in the anterior left maxillary sinus, but no sinusitis is seen.  The nasal turbinates are normal in size and position.  IMPRESSION: No maxillofacial fracture is seen.  The sinuses are clear.  CT CERVICAL SPINE  Findings:   The cervical vertebrae are in normal alignment.  There is degenerative disc disease primarily at C4-5, C5-6, and C6-7 levels with loss of disc space and spurring.  Posterior osteophytes at C5-6 and C6-7 levels somewhat narrow the central canal.  No acute fracture is seen.  The odontoid process is intact.  There are diffuse degenerative  changes throughout the facet joints of the cervical spine.  Foraminal narrowing is noted at multiple levels as well.  The thyroid gland is unremarkable.  IMPRESSION:  1.  Diffuse degenerative disc disease with some foraminal narrowing and mild central canal narrowing at several levels.  2.  Osteopenia.  No acute fracture.   Original Report Authenticated By: Dwyane Dee, M.D.   Ct Cervical Spine Wo Contrast  01/01/2013   *RADIOLOGY REPORT*  Clinical Data:  Hypoglycemia  CT HEAD WITHOUT CONTRAST CT MAXILLOFACIAL WITHOUT CONTRAST CT CERVICAL SPINE WITHOUT CONTRAST  Technique:  Multidetector CT imaging of the head, cervical spine, and maxillofacial structures were performed using the standard protocol without intravenous contrast. Multiplanar CT image reconstructions of the cervical spine and maxillofacial structures were also generated.  Comparison:   None  CT HEAD  Findings: The ventricular system is prominent, as are the cortical sulci diffusely thickened, consistent with atrophy.  Moderate small vessel ischemic change is noted throughout the periventricular white matter.  No hemorrhage, mass lesion, or acute infarction is seen.  On bone window images, no calvarial abnormality is noted. The paranasal sinuses that are visualized are clear.  IMPRESSION: Moderate atrophy and small vessel ischemic change.  No acute intracranial abnormality.  CT  MAXILLOFACIAL  Findings:  No maxillofacial fracture is seen.  The orbital rims and zygomatic arches are intact.  No nasal bone fracture is seen. There is a probable retention cyst noted in the anterior left maxillary sinus, but no sinusitis is seen.  The nasal turbinates are normal in size and position.  IMPRESSION: No maxillofacial fracture is seen.  The sinuses are clear.  CT CERVICAL SPINE  Findings:   The cervical vertebrae are in normal alignment.  There is degenerative disc disease primarily at C4-5, C5-6, and C6-7 levels with loss of disc space and spurring.  Posterior osteophytes at C5-6 and C6-7 levels somewhat narrow the central canal.  No acute fracture is seen.  The odontoid process is intact.  There are diffuse degenerative changes throughout the facet joints of the cervical spine.  Foraminal narrowing is noted at multiple levels as well.  The thyroid gland is unremarkable.  IMPRESSION:  1.  Diffuse degenerative disc disease with some foraminal narrowing and mild central canal narrowing at several levels.  2.  Osteopenia.  No acute fracture.   Original Report Authenticated By: Dwyane Dee, M.D.   Mr Brain Wo Contrast  01/01/2013   *RADIOLOGY REPORT*  Clinical Data: Syncope.  Hyperglycemia.  Recent falls.  Diabetes and hypertension.  MRI HEAD WITHOUT CONTRAST  Technique:  Multiplanar, multiecho pulse sequences of the brain and surrounding structures were obtained according to standard protocol without intravenous contrast.  Comparison: 01/01/2013 head CT.  03/16/2012 brain MR.  Findings: No acute infarct.  Right lateral orbital subcutaneous hematoma.  No intracranial hemorrhage.  Prominent small vessel disease type changes.  Global atrophy.  Ventricular prominence possibly related to atrophy although superimposed mild hydrocephalus is a possibility.  Overall appearance is without significant change.  No intracranial mass lesion detected on this unenhanced exam.  Partially empty sella felt to be  incidental finding.  Cervical medullary junction, pineal region and orbital structures unremarkable.  Major intracranial vascular structures are patent.  Sub centimeter nonspecific left parotid lesion without change.  IMPRESSION: No acute infarct.  Right lateral orbital subcutaneous hematoma.  No intracranial hemorrhage.  Prominent small vessel disease type changes.  Global atrophy.  Ventricular prominence possibly related to atrophy although superimposed mild hydrocephalus not excluded.  Overall appearance without change.   Original Report Authenticated By: Lacy Duverney, M.D.   Ct Maxillofacial Wo Cm  01/01/2013   *RADIOLOGY REPORT*  Clinical Data:  Hypoglycemia  CT HEAD WITHOUT CONTRAST CT MAXILLOFACIAL WITHOUT CONTRAST CT CERVICAL SPINE WITHOUT CONTRAST  Technique:  Multidetector CT imaging of the head, cervical spine, and maxillofacial structures were performed using the standard protocol without intravenous contrast. Multiplanar CT image reconstructions of the cervical spine and maxillofacial structures were also generated.  Comparison:   None  CT HEAD  Findings: The ventricular system is prominent, as are the cortical sulci diffusely thickened, consistent with atrophy.  Moderate small vessel ischemic change is noted throughout the periventricular white matter.  No hemorrhage, mass lesion, or acute infarction is seen.  On bone window images, no calvarial abnormality is noted. The paranasal sinuses that are visualized are clear.  IMPRESSION: Moderate atrophy and small vessel ischemic change.  No acute intracranial abnormality.  CT MAXILLOFACIAL  Findings:  No maxillofacial fracture is seen.  The orbital rims and zygomatic arches are intact.  No nasal bone fracture is seen. There is a probable retention cyst noted in the anterior left maxillary sinus, but no sinusitis is seen.  The nasal turbinates are normal in size and position.  IMPRESSION: No maxillofacial fracture is seen.  The sinuses are clear.  CT  CERVICAL SPINE  Findings:   The cervical vertebrae are in normal alignment.  There is degenerative disc disease primarily at C4-5, C5-6, and C6-7 levels with loss of disc space and spurring.  Posterior osteophytes at C5-6 and C6-7 levels somewhat narrow the central canal.  No acute fracture is seen.  The odontoid process is intact.  There are diffuse degenerative changes throughout the facet joints of the cervical spine.  Foraminal narrowing is noted at multiple levels as well.  The thyroid gland is unremarkable.  IMPRESSION:  1.  Diffuse degenerative disc disease with some foraminal narrowing and mild central canal narrowing at several levels.  2.  Osteopenia.  No acute fracture.   Original Report Authenticated By: Dwyane Dee, M.D.    Scheduled Meds: . donepezil  5 mg Oral QHS  . folic acid  1 mg Oral Daily  . metoprolol tartrate  12.5 mg Oral BID  . multivitamin with minerals  1 tablet Oral Daily  . sodium chloride  3 mL Intravenous Q12H  . thiamine  100 mg Oral Daily   Or  . thiamine  100 mg Intravenous Daily   Continuous Infusions:    Principal Problem:   Syncope Active Problems:   Diabetes mellitus without complication   Hypertension   Hypoglycemia   Delirium   Dementia    Time spent: > 35 minutes    Penny Pia  [facility] Pager [phone] If 7PM-7AM, please contact night-coverage at [email or web address], password [facility] 01/03/2013, 1:07 PM  LOS: 2 days

## 2013-01-03 NOTE — Progress Notes (Signed)
01/02/13-23:00 Pt con't to try to get out of bed, pulling at IV & the telemonitor & condom cath, pt very confused & noncompliant, & being more & more irritable & agitated, even though 1mg  of Haldol was given earlier for anxiety & agitation, which is escalating. K.Shorr,NP notified need for restraints at 23:30 due to escalating anxiety,& agitation, & noncompliance with staff. Waist belt Mittens & bil wrist restraints applied for pt's safety. IV will have to be restarted due to infiltrate from pt pulling at site before Haldol dose can be administered, but will give Haldol 2.5mg  as ordered when new IV site obtained.Med given at 23:35pm.

## 2013-01-03 NOTE — Progress Notes (Signed)
Physical Therapy Treatment Patient Details Name: SHELDON SEM MRN: [id number] DOB: April 30, 1932 Today's Date: 01/03/2013 Time: 4098-1191 PT Time Calculation (min): 12 min  PT Assessment / Plan / Recommendation Comments on Treatment Session  Pt agreeable to ambulate in hallway and used RW for safety.      Follow Up Recommendations  Supervision/Assistance - 24 hour;SNF     Does the patient have the potential to tolerate intense rehabilitation     Barriers to Discharge        Equipment Recommendations  Rolling walker with 5" wheels    Recommendations for Other Services    Frequency     Plan Discharge plan remains appropriate;Frequency remains appropriate    Precautions / Restrictions Precautions Precautions: Fall   Pertinent Vitals/Pain No pain, dizziness with ambulation     Mobility  Bed Mobility Bed Mobility: Supine to Sit Supine to Sit: 4: Min assist Details for Bed Mobility Assistance: assist for trunk, dizzy sitting EOB so waited before standing Transfers Transfers: Sit to Stand;Stand to Sit Sit to Stand: 4: Min assist;With upper extremity assist;From bed;From chair/3-in-1 Stand to Sit: To chair/3-in-1;With upper extremity assist;To bed;4: Min assist Stand Pivot Transfers: 4: Min assist Details for Transfer Assistance: verbal cues for safety, pt reported dizziness with standing but felt able to continue with ambulation Ambulation/Gait Ambulation/Gait Assistance: 4: Min assist Ambulation Distance (Feet): 140 Feet Assistive device: Rolling walker Ambulation/Gait Assistance Details: verbal cues for safe use of RW, posture, pt reported dizziness however did not resolve or get worse so able to continue, pt fatigued quickly (also received Ativan just prior to arrival) so nsg tech brought chair behind pt Gait Pattern: Step-through pattern;Trunk flexed General Gait Details: assist to steady and manual assist with RW for turning    Exercises     PT Diagnosis:    PT  Problem List:   PT Treatment Interventions:     PT Goals Acute Rehab PT Goals PT Goal: Supine/Side to Sit - Progress: Progressing toward goal PT Goal: Sit to Stand - Progress: Progressing toward goal PT Goal: Stand to Sit - Progress: Progressing toward goal PT Goal: Ambulate - Progress: Progressing toward goal  Visit Information  Last PT Received On: 01/03/13 Assistance Needed: +1    Subjective Data  Subjective: Yeah I'll walk.   Cognition  Cognition Arousal/Alertness: Awake/alert Behavior During Therapy: WFL for tasks assessed/performed    Balance     End of Session PT - End of Session Activity Tolerance: Patient limited by fatigue Patient left: in bed;with call bell/phone within reach;with bed alarm set;Other (comment) (nsg tech assisted with pt back to bed)   GP     Britania Shreeve,KATHrine E 01/03/2013, 3:56 PM Zenovia Jarred, PT, DPT 01/03/2013 Pager: [phone]

## 2013-01-03 NOTE — Plan of Care (Signed)
Problem: Consults Goal: General Medical Patient Education See Patient Education Module for specific education.  Outcome: Not Met (add Reason) Unable to educate, pt too confused & disoriented

## 2013-01-04 LAB — MAGNESIUM: Magnesium: 1.8 mg/dL (ref 1.5–2.5)

## 2013-01-04 LAB — GLUCOSE, CAPILLARY
Glucose-Capillary: 76 mg/dL (ref 70–99)
Glucose-Capillary: 107 mg/dL — ABNORMAL HIGH (ref 70–99)
Glucose-Capillary: 153 mg/dL — ABNORMAL HIGH (ref 70–99)

## 2013-01-04 LAB — URINE CULTURE

## 2013-01-04 MED ORDER — DONEPEZIL HCL 5 MG PO TABS: 5.0000 mg | ORAL_TABLET | Freq: Every day | ORAL | Status: DC

## 2013-01-04 MED ORDER — FOLIC ACID 1 MG PO TABS: 1.0000 mg | ORAL_TABLET | Freq: Every day | ORAL | Status: DC

## 2013-01-04 MED ORDER — HALOPERIDOL LACTATE 5 MG/ML IJ SOLN: 2.0000 mg | Freq: Four times a day (QID) | INTRAMUSCULAR | Status: DC | PRN

## 2013-01-04 MED ORDER — HALOPERIDOL 2 MG PO TABS
2.0000 mg | ORAL_TABLET | Freq: Four times a day (QID) | ORAL | Status: DC | PRN
  Filled 2013-01-04 (×2): qty 1

## 2013-01-04 MED ORDER — HALOPERIDOL 2 MG PO TABS: 2.0000 mg | ORAL_TABLET | Freq: Four times a day (QID) | ORAL | Status: DC | PRN

## 2013-01-04 MED ORDER — METOPROLOL TARTRATE 12.5 MG HALF TABLET: 12.5000 mg | ORAL_TABLET | Freq: Two times a day (BID) | ORAL | Status: DC

## 2013-01-04 MED ORDER — ADULT MULTIVITAMIN W/MINERALS CH: 1.0000 | ORAL_TABLET | Freq: Every day | ORAL | Status: DC

## 2013-01-04 NOTE — Progress Notes (Signed)
CSW met with patient's granddaughter. She states that paula is not patient's sister but is an old girlfriend. She does not want her to be able to see patient. Granddaughter just found out patient was in hospital last night. Discussed snf placement, she is [facility]. [facility]. Patient currently in restraints. Can go tomorrow if med rec by 4pm. Text paged MD of same.  Nnamdi Dacus C. Alinda Egolf MSW, LCSW [phone]

## 2013-01-04 NOTE — Progress Notes (Signed)
[facility] PROGRESS NOTE  Johnny Hayden:[id number] DOB: 01-08-1932 DOA: 01/01/2013 PCP: No primary provider on file.  Assessment/Plan: 1. Delirium vs dementia - Psychiatry has deemed patient unable to make his own medical decisions.   - Social worker reports that lady identifying herself as patient's sister was an old girl friend.  Currently grand daughter is making medical decisions for patient and wishes to transition patient to SNF.  Med rec has been completed so that patient may be transferred tomorrow.   - MRI showed no acute infarct but does show global atrophy - Vit b12 is elevated, TSH wnl, hgba1c wnl,  Folate wnl, and rpr is non reactive - UDS was negative - Will discontinue restraints and will increase dose of haldol for prn agitation.    2. DM - hemoglobin a1c within normal limits at 5.2  - would plan on holding metformin and treating with diabetic diet. - hypoglycemia resolved.  3. History of alcohol use/abuse - B 12 levels within normal limits - continue ciwa protocol  4. Questionable new onset atrial flutter - no red flags reported on telemetry - may have been artifact as patient is currently in sinus rhythm. - Has been sinus rhythm on telemetry. No red flags reported to me.   Code Status: full Family Communication:  Discussed with patient and his sister. Disposition Plan: Pending further work up and psychiatry evaluation and recommendations.   Consultants:  Psychiatry  Procedures:  Echocardiogram  Carotid dopplers  Antibiotics:  None  HPI/Subjective: Patient reportedly was confused overnight and required restraints.  No new complaints.  Reportedly lady identifying herself as sister was actually old girlfriend.  Grand daughter is currently making medical decisions for patient.  Plans are for SNF next am.  Objective: Filed Vitals:   01/03/13 2101 01/03/13 2229 01/04/13 0417 01/04/13 0502  BP: 169/84 158/99 156/98   Pulse: 72  94    Temp: 98.6 F (37 C)  97.5 F (36.4 C)   TempSrc: Oral  Oral   Resp: 16  16   Height:      Weight:    62.5 kg (137 lb 12.6 oz)  SpO2: 97%  99%     Intake/Output Summary (Last 24 hours) at 01/04/13 1134 Last data filed at 01/04/13 0501  Gross per 24 hour  Intake    540 ml  Output   1900 ml  Net  -1360 ml   Filed Weights   01/02/13 0623 01/03/13 0500 01/04/13 0502  Weight: 61.281 kg (135 lb 1.6 oz) 61.236 kg (135 lb) 62.5 kg (137 lb 12.6 oz)    Exam:   General:  Pt in NAD, Alert and Awake  Cardiovascular: RRR, no MRG  Respiratory: CTA BL, no wheezes  Abdomen: soft, NT, ND  Musculoskeletal: no cyanosis or clubbing   Data Reviewed: Basic Metabolic Panel:  Recent Labs Lab 01/01/13 1311 01/02/13 0529 01/03/13 0525 01/04/13 0507  NA 138 140  --   --   K 3.5 4.1  --   --   CL 101 104  --   --   CO2  --  30  --   --   GLUCOSE 105* 105*  --   --   BUN 19 14  --   --   CREATININE 0.80 0.95  --   --   CALCIUM  --  8.5  --   --   MG  --  2.1 1.9 1.8   Liver Function Tests:  Recent Labs Lab 01/01/13  1435  AST 34  ALT 26  ALKPHOS 90  BILITOT 0.8  PROT 6.6  ALBUMIN 3.5   No results found for this basename: LIPASE, AMYLASE,  in the last 168 hours No results found for this basename: AMMONIA,  in the last 168 hours CBC:  Recent Labs Lab 01/01/13 1305 01/01/13 1311 01/02/13 0529  WBC 4.3  --  4.4  NEUTROABS 3.4  --   --   HGB 14.8 14.6 12.6*  HCT 40.7 43.0 36.1*  MCV 92.1  --  91.9  PLT 160  --  132*   Cardiac Enzymes:  Recent Labs Lab 01/01/13 1300  CKTOTAL 79   BNP (last 3 results) No results found for this basename: PROBNP,  in the last 8760 hours CBG:  Recent Labs Lab 01/03/13 0757 01/03/13 1144 01/03/13 1637 01/03/13 2101 01/04/13 0750  GLUCAP 98 160* 138* 190* 76    Recent Results (from the past 240 hour(s))  URINE CULTURE     Status: None   Collection Time    01/01/13  9:04 PM      Result Value Range Status   Specimen  Description URINE, CLEAN CATCH   Final   Special Requests NONE   Final   Culture  Setup Time 01/02/2013 03:03   Final   Colony Count 50,000 COLONIES/ML   Final   Culture GRAM NEGATIVE RODS   Final   Report Status PENDING   Incomplete     Studies: No results found.  Scheduled Meds: . donepezil  5 mg Oral QHS  . folic acid  1 mg Oral Daily  . metoprolol tartrate  12.5 mg Oral BID  . multivitamin with minerals  1 tablet Oral Daily  . sodium chloride  3 mL Intravenous Q12H  . thiamine  100 mg Oral Daily   Or  . thiamine  100 mg Intravenous Daily   Continuous Infusions:    Principal Problem:   Syncope Active Problems:   Diabetes mellitus without complication   Hypertension   Hypoglycemia   Delirium   Dementia    Time spent: > 35 minutes    Penny Pia  [facility] Pager [phone] If 7PM-7AM, please contact night-coverage at [email or web address], password [facility] 01/04/2013, 11:34 AM  LOS: 3 days

## 2013-01-04 NOTE — Progress Notes (Signed)
CSW received report from 4E CSW, Becky Sax that pt transferred to [facility] and plan is to discharge to [facility] tomorrow as long as d/c med rec is completed today by 4 pm.  CSW reviewed chart and noted that d/c med rec was completed and CSW faxed d/c med rec to [facility] via [facility].  CSW will notify weekend CSW in order for weekend CSW to facilitate pt d/c during weekend to [facility].  Jacklynn Lewis, MSW, LCSWA  Clinical Social Work [phone]

## 2013-01-04 NOTE — Progress Notes (Signed)
Clinical Social Work Department BRIEF PSYCHOSOCIAL ASSESSMENT 01/04/2013  Patient:  Johnny Hayden, Johnny Hayden     Account Number:  [id number]     Admit date:  01/01/2013  Clinical Social Worker:  Hattie Perch  Date/Time:  01/04/2013 12:00 M  Referred by:  Physician  Date Referred:  01/04/2013 Referred for  SNF Placement   Other Referral:   Interview type:  Family Other interview type:    PSYCHOSOCIAL DATA Living Status:  ALONE Admitted from facility:   Level of care:   Primary support name:  Christella Noa Primary support relationship to patient:  SIBLING Degree of support available:   fair    CURRENT CONCERNS Current Concerns  Post-Acute Placement   Other Concerns:    SOCIAL WORK ASSESSMENT / PLAN Patient is disoriented and was found to not have capacity. CSW spoke with patient's sister. the plan was originally to take patient home with 24 hour care but that is going to take time to set up so they are now agreeable to snf placement.   Assessment/plan status:   Other assessment/ plan:   Information/referral to community resources:    PATIENT'S/FAMILY'S RESPONSE TO PLAN OF CARE: sister reluctantly agreeable to snf placement since patient is medically ready and 24 supervision is not yet set up.

## 2013-01-04 NOTE — Progress Notes (Signed)
Clinical Social Work  Per chart review, psych MD reports that patient does not have capacity to make his own decisions. Sister has been involved with care. Sister called psych CSW inquiring about placement options and psych CSW alerted unit CSW for assistance. Psych CSW is signing off but available if further needs arise.  [place], [place] [phone]

## 2013-01-04 NOTE — Progress Notes (Signed)
Physical Therapy Treatment [name] MRN: [id number] DOB: 1932-06-29 Today's Date: 01/04/2013 Time: 4098-1191 PT Time Calculation (min): 25 min  PT Assessment / Plan / Recommendation Comments on Treatment Session  Gait pattern appears different based on notes from last session. Shuffle pattern and flexed trunk throughout. Continue to recommend SNF. High fall risk. Pt fatigues fairly easily-sound asleep almost immediately once back in bed.     Follow Up Recommendations  SNF;Supervision/Assistance - 24 hour     Does the patient have the potential to tolerate intense rehabilitation     Barriers to Discharge        Equipment Recommendations  Rolling walker with 5" wheels    Recommendations for Other Services    Frequency Min 3X/week   Plan Discharge plan remains appropriate    Precautions / Restrictions Precautions Precautions: Fall Restrictions Weight Bearing Restrictions: No   Pertinent Vitals/Pain No c/o pain    Mobility  Bed Mobility Bed Mobility: Supine to Sit;Sit to Supine Supine to Sit: 4: Min assist Sit to Supine: 4: Min guard Details for Bed Mobility Assistance: Assist for trunk to full upright and to stabilize during transition Transfers Transfers: Sit to Stand;Stand to Sit Sit to Stand: 4: Min assist;From bed Stand to Sit: 4: Min assist;To bed Details for Transfer Assistance: Assist to rise, stabilze, control descent. Moderately unsteady.  Ambulation/Gait Ambulation/Gait Assistance: 4: Min assist Ambulation Distance (Feet): 145 Feet Assistive device: Rolling walker Ambulation/Gait Assistance Details: Assist to stabilize pt throughout ambulation and to maneuver walker. Max cues for safety, distance from RW. Fatigues fairly easily. Festinating gait pattern at times. Shuffled for most of distance. Gait pattern varied throughout distance. Very slow gait speed.  Gait Pattern: Trunk flexed;Shuffle;Step-to pattern;Step-through pattern     Exercises     PT Diagnosis:    PT Problem List:   PT Treatment Interventions:     PT Goals Acute Rehab PT Goals Pt will go Supine/Side to Sit: with supervision PT Goal: Supine/Side to Sit - Progress: Progressing toward goal Pt will go Sit to Stand: with supervision PT Goal: Sit to Stand - Progress: Progressing toward goal Pt will go Stand to Sit: with supervision PT Goal: Stand to Sit - Progress: Progressing toward goal Pt will Ambulate: 51 - 150 feet;with supervision;with least restrictive assistive device PT Goal: Ambulate - Progress: Progressing toward goal  Visit Information  Last PT Received On: 01/04/13 Assistance Needed: +1    Subjective Data      Cognition  Cognition Arousal/Alertness: Awake/alert Behavior During Therapy: [facility] for tasks assessed/performed    Balance     End of Session PT - End of Session Equipment Utilized During Treatment: Gait belt Activity Tolerance: Patient limited by fatigue Patient left: in bed;with call bell/phone within reach;with bed alarm set   GP     Rebeca Alert, MPT Pager: [phone]

## 2013-01-04 NOTE — Care Management Note (Signed)
    Page 1 of 1   01/04/2013     3:36:34 PM   CARE MANAGEMENT NOTE 01/04/2013  Patient:  Johnny Hayden, Johnny Hayden   Account Number:  [id number]  Date Initiated:  01/04/2013  Documentation initiated by:  Lanier Clam  Subjective/Objective Assessment:   ADMITTED W/SYNCOPE.     Action/Plan:   FROM HOME W/24HR CAREGIVER.   Anticipated DC Date:  01/05/2013   Anticipated DC Plan:  SKILLED NURSING FACILITY      DC Planning Services  CM consult      Choice offered to / List presented to:             Status of service:  In process, will continue to follow Medicare Important Message given?   (If response is "NO", the following Medicare IM given date fields will be blank) Date Medicare IM given:   Date Additional Medicare IM given:    Discharge Disposition:    Per UR Regulation:  Reviewed for med. necessity/level of care/duration of stay  If discussed at Long Length of Stay Meetings, dates discussed:    Comments:  01/04/13 Taniah Reinecke RN,BSN NCM [phone] TRANSFERRED TO 3E.D/C SNF IN AM.

## 2013-01-05 DIAGNOSIS — N390 Urinary tract infection, site not specified: Secondary | ICD-10-CM

## 2013-01-05 LAB — GLUCOSE, CAPILLARY: Glucose-Capillary: 80 mg/dL (ref 70–99)

## 2013-01-05 MED ORDER — CIPROFLOXACIN HCL 500 MG PO TABS
500.0000 mg | ORAL_TABLET | Freq: Two times a day (BID) | ORAL | Status: DC
  Administered 2013-01-05: 500 mg via ORAL
  Filled 2013-01-05 (×3): qty 1

## 2013-01-05 MED ORDER — HYDROCHLOROTHIAZIDE 12.5 MG PO TABS: 25.0000 mg | ORAL_TABLET | Freq: Every day | ORAL | Status: DC

## 2013-01-05 MED ORDER — CIPROFLOXACIN HCL 500 MG PO TABS: 500.0000 mg | ORAL_TABLET | Freq: Two times a day (BID) | ORAL | Status: DC

## 2013-01-05 NOTE — Progress Notes (Signed)
Paged NP about + urine culture results.

## 2013-01-05 NOTE — Progress Notes (Signed)
Pt. Was discharged to [facility]. Report was attempted to be called to facility with no answer. Pt. Discharge information and prescriptions were sent with packet of information with transport services.

## 2013-01-05 NOTE — Progress Notes (Signed)
Orders placed for po antibiotics to start in am per NP on call.

## 2013-01-05 NOTE — Progress Notes (Signed)
Per MD, Pt ready for d/c.  Notified RN, Pt, family and facility.  Per Sam at facility, facility has everything they need to accept Pt.  Facility ready to receive Pt.  Arranged for transportation.  Providence Crosby, LCSWA Clinical Social Work [phone]

## 2013-01-05 NOTE — Discharge Summary (Signed)
Physician Discharge Summary  Johnny Hayden:[id number] DOB: 04/16/1932 DOA: 01/01/2013  PCP: No primary provider on file.  Admit date: 01/01/2013 Discharge date: 01/05/2013  Time spent: > 35 minutes  Recommendations for Outpatient Follow-up:  1. Please be sure to follow up with your primary care physician 2. Also follow up on blood pressures and adjust antihypertensive regimen 3. Was found hypoglycemic and last hemoglobin a1c normal as such discontinued metformin.  Discharge Diagnoses:  Principal Problem:   Syncope Active Problems:   Diabetes mellitus without complication   Hypertension   Hypoglycemia   Delirium   Dementia   Discharge Condition: Stable  Diet recommendation: Low sodium, heart healthy  Filed Weights   01/03/13 0500 01/04/13 0502 01/05/13 0500  Weight: 61.236 kg (135 lb) 62.5 kg (137 lb 12.6 oz) 64.7 kg (142 lb 10.2 oz)    History of present illness:  79 y/o with dementia and diabetes that was found at his outside of his home on the ground.  Reportedly was locked out of his home for 3 days.  Hospital Course:   1. Delirium vs dementia - Psychiatry has deemed patient unable to make his own medical decisions.  - Social worker reports that lady identifying herself as patient's sister was an old girl friend. Currently grand daughter is making medical decisions for patient and wishes to transition patient to SNF.  - MRI showed no acute infarct but does show global atrophy  - Vit b12 is elevated, TSH wnl, hgba1c wnl, Folate wnl, and rpr is non reactive  - UDS was negative  - Patient to continue PT at [facility] after discharge per Physical therapist evaluation  2. DM  - hemoglobin a1c within normal limits at 5.2  - would plan on holding metformin and treating with diabetic diet.  - hypoglycemia resolved.   3. History of alcohol use/abuse  - B 12 levels within normal limits  - No anxiety on day of discharge  4. Questionable new onset atrial flutter  - no red  flags reported on telemetry  - may have been artifact as patient is currently in sinus rhythm.  - Has been sinus bradycardia while in house on B blocker.  Therefore will discontinue B blocker  5. HTN - Patient was in sinus bradycardia while on B blocker and heart rate has gone as low as 50 although blood pressure has been hypertensive.  Will discontinue B blocker and start patient back on his hctz.   6. UTI - Urine culture grew out morganella morganii sensitive to cipro - will plan on treating for 3 days for uncomplicated uti   Procedures:  none  Consultations:  none  Discharge Exam: Filed Vitals:   01/04/13 1257 01/04/13 1945 01/04/13 2100 01/05/13 0500  BP: 135/74  127/76 156/71  Pulse: 68 68 75 50  Temp: 98 F (36.7 C)  98.3 F (36.8 C) 98 F (36.7 C)  TempSrc:   Oral Oral  Resp: 18  16 20   Height:      Weight:    64.7 kg (142 lb 10.2 oz)  SpO2: 98%  99% 100%    General: Pt in NAD, Alert and awake Cardiovascular: RRR, no MRG Respiratory: CTA BL, no wheezes.  Discharge Instructions  Discharge Orders   Future Orders Complete By Expires     Call MD for:  redness, tenderness, or signs of infection (pain, swelling, redness, odor or green/yellow discharge around incision site)  As directed     Call MD for:  temperature >  100.4  As directed     Diet - low sodium heart healthy  As directed     Diet - low sodium heart healthy  As directed     Increase activity slowly  As directed     Increase activity slowly  As directed         Medication List    STOP taking these medications       losartan-hydrochlorothiazide 100-25 MG per tablet  Commonly known as:  HYZAAR     meloxicam 15 MG tablet  Commonly known as:  MOBIC     memantine tablet pack  Commonly known as:  NAMENDA TITRATION PACK     metFORMIN 500 MG tablet  Commonly known as:  GLUCOPHAGE      TAKE these medications       allopurinol 300 MG tablet  Commonly known as:  ZYLOPRIM  Take 300 mg by mouth  daily.     ascorbic acid 500 MG tablet  Commonly known as:  VITAMIN C  Take 500 mg by mouth daily.     CINNAMON PO  Take 1 capsule by mouth daily.     ciprofloxacin 500 MG tablet  Commonly known as:  CIPRO  Take 1 tablet (500 mg total) by mouth 2 (two) times daily.     donepezil 5 MG tablet  Commonly known as:  ARICEPT  Take 1 tablet (5 mg total) by mouth at bedtime.     fish oil-omega-3 fatty acids 1000 MG capsule  Take 1 g by mouth daily.     folic acid 1 MG tablet  Commonly known as:  FOLVITE  Take 1 tablet (1 mg total) by mouth daily.     haloperidol 2 MG tablet  Commonly known as:  HALDOL  Take 1 tablet (2 mg total) by mouth every 6 (six) hours as needed.     hydrochlorothiazide 12.5 MG tablet  Commonly known as:  HYDRODIURIL  Take 2 tablets (25 mg total) by mouth daily.     multivitamin with minerals Tabs  Take 1 tablet by mouth daily.     thiamine 100 MG tablet  Commonly known as:  VITAMIN B-1  Take 100 mg by mouth daily.     vitamin B-12 1000 MCG tablet  Commonly known as:  CYANOCOBALAMIN  Take 1,000 mcg by mouth daily.       Allergies  Allergen Reactions  . Augmentin (Amoxicillin-Pot Clavulanate) Other (See Comments)    Reaction Unknown, from Doctor's office  . Zestril (Lisinopril) Other (See Comments)    Reaction Unknown, from Doctor's office      The results of significant diagnostics from this hospitalization (including imaging, microbiology, ancillary and laboratory) are listed below for reference.    Significant Diagnostic Studies: Dg Chest 1 View  01/01/2013   *RADIOLOGY REPORT*  Clinical Data: Fall, weakness  CHEST - 1 VIEW  Comparison: 11/15/2005  Findings: Cardiomediastinal silhouette is stable.  No acute infiltrate or pleural effusion.  No pulmonary edema.  Degenerative changes are noted right AC joint.  IMPRESSION: No active disease.   Original Report Authenticated By: Natasha Mead, M.D.   Dg Hip Complete Right  01/01/2013   *RADIOLOGY  REPORT*  Clinical Data: Right hip pain.  Falls.  Hip discomfort.  RIGHT HIP - COMPLETE 2+ VIEW  Comparison: None available.  Findings: Degenerative changes are present in the right hip and at the pubic symphysis.  There are degenerative changes in the SI joints and lower lumbar spine bilaterally.  No acute fracture or dislocation is evident.  IMPRESSION: Degenerative changes of the pelvis and right hip without evidence for acute fracture or dislocation.   Original Report Authenticated By: Marin Roberts, M.D.   Ct Head Wo Contrast  01/01/2013   *RADIOLOGY REPORT*  Clinical Data:  Hypoglycemia  CT HEAD WITHOUT CONTRAST CT MAXILLOFACIAL WITHOUT CONTRAST CT CERVICAL SPINE WITHOUT CONTRAST  Technique:  Multidetector CT imaging of the head, cervical spine, and maxillofacial structures were performed using the standard protocol without intravenous contrast. Multiplanar CT image reconstructions of the cervical spine and maxillofacial structures were also generated.  Comparison:   None  CT HEAD  Findings: The ventricular system is prominent, as are the cortical sulci diffusely thickened, consistent with atrophy.  Moderate small vessel ischemic change is noted throughout the periventricular white matter.  No hemorrhage, mass lesion, or acute infarction is seen.  On bone window images, no calvarial abnormality is noted. The paranasal sinuses that are visualized are clear.  IMPRESSION: Moderate atrophy and small vessel ischemic change.  No acute intracranial abnormality.  CT MAXILLOFACIAL  Findings:  No maxillofacial fracture is seen.  The orbital rims and zygomatic arches are intact.  No nasal bone fracture is seen. There is a probable retention cyst noted in the anterior left maxillary sinus, but no sinusitis is seen.  The nasal turbinates are normal in size and position.  IMPRESSION: No maxillofacial fracture is seen.  The sinuses are clear.  CT CERVICAL SPINE  Findings:   The cervical vertebrae are in normal  alignment.  There is degenerative disc disease primarily at C4-5, C5-6, and C6-7 levels with loss of disc space and spurring.  Posterior osteophytes at C5-6 and C6-7 levels somewhat narrow the central canal.  No acute fracture is seen.  The odontoid process is intact.  There are diffuse degenerative changes throughout the facet joints of the cervical spine.  Foraminal narrowing is noted at multiple levels as well.  The thyroid gland is unremarkable.  IMPRESSION:  1.  Diffuse degenerative disc disease with some foraminal narrowing and mild central canal narrowing at several levels.  2.  Osteopenia.  No acute fracture.   Original Report Authenticated By: Dwyane Dee, M.D.   Ct Cervical Spine Wo Contrast  01/01/2013   *RADIOLOGY REPORT*  Clinical Data:  Hypoglycemia  CT HEAD WITHOUT CONTRAST CT MAXILLOFACIAL WITHOUT CONTRAST CT CERVICAL SPINE WITHOUT CONTRAST  Technique:  Multidetector CT imaging of the head, cervical spine, and maxillofacial structures were performed using the standard protocol without intravenous contrast. Multiplanar CT image reconstructions of the cervical spine and maxillofacial structures were also generated.  Comparison:   None  CT HEAD  Findings: The ventricular system is prominent, as are the cortical sulci diffusely thickened, consistent with atrophy.  Moderate small vessel ischemic change is noted throughout the periventricular white matter.  No hemorrhage, mass lesion, or acute infarction is seen.  On bone window images, no calvarial abnormality is noted. The paranasal sinuses that are visualized are clear.  IMPRESSION: Moderate atrophy and small vessel ischemic change.  No acute intracranial abnormality.  CT MAXILLOFACIAL  Findings:  No maxillofacial fracture is seen.  The orbital rims and zygomatic arches are intact.  No nasal bone fracture is seen. There is a probable retention cyst noted in the anterior left maxillary sinus, but no sinusitis is seen.  The nasal turbinates are normal  in size and position.  IMPRESSION: No maxillofacial fracture is seen.  The sinuses are clear.  CT CERVICAL SPINE  Findings:  The cervical vertebrae are in normal alignment.  There is degenerative disc disease primarily at C4-5, C5-6, and C6-7 levels with loss of disc space and spurring.  Posterior osteophytes at C5-6 and C6-7 levels somewhat narrow the central canal.  No acute fracture is seen.  The odontoid process is intact.  There are diffuse degenerative changes throughout the facet joints of the cervical spine.  Foraminal narrowing is noted at multiple levels as well.  The thyroid gland is unremarkable.  IMPRESSION:  1.  Diffuse degenerative disc disease with some foraminal narrowing and mild central canal narrowing at several levels.  2.  Osteopenia.  No acute fracture.   Original Report Authenticated By: Dwyane Dee, M.D.   Mr Brain Wo Contrast  01/01/2013   *RADIOLOGY REPORT*  Clinical Data: Syncope.  Hyperglycemia.  Recent falls.  Diabetes and hypertension.  MRI HEAD WITHOUT CONTRAST  Technique:  Multiplanar, multiecho pulse sequences of the brain and surrounding structures were obtained according to standard protocol without intravenous contrast.  Comparison: 01/01/2013 head CT.  03/16/2012 brain MR.  Findings: No acute infarct.  Right lateral orbital subcutaneous hematoma.  No intracranial hemorrhage.  Prominent small vessel disease type changes.  Global atrophy.  Ventricular prominence possibly related to atrophy although superimposed mild hydrocephalus is a possibility.  Overall appearance is without significant change.  No intracranial mass lesion detected on this unenhanced exam.  Partially empty sella felt to be incidental finding.  Cervical medullary junction, pineal region and orbital structures unremarkable.  Major intracranial vascular structures are patent.  Sub centimeter nonspecific left parotid lesion without change.  IMPRESSION: No acute infarct.  Right lateral orbital subcutaneous  hematoma.  No intracranial hemorrhage.  Prominent small vessel disease type changes.  Global atrophy.  Ventricular prominence possibly related to atrophy although superimposed mild hydrocephalus not excluded.  Overall appearance without change.   Original Report Authenticated By: Lacy Duverney, M.D.   Ct Maxillofacial Wo Cm  01/01/2013   *RADIOLOGY REPORT*  Clinical Data:  Hypoglycemia  CT HEAD WITHOUT CONTRAST CT MAXILLOFACIAL WITHOUT CONTRAST CT CERVICAL SPINE WITHOUT CONTRAST  Technique:  Multidetector CT imaging of the head, cervical spine, and maxillofacial structures were performed using the standard protocol without intravenous contrast. Multiplanar CT image reconstructions of the cervical spine and maxillofacial structures were also generated.  Comparison:   None  CT HEAD  Findings: The ventricular system is prominent, as are the cortical sulci diffusely thickened, consistent with atrophy.  Moderate small vessel ischemic change is noted throughout the periventricular white matter.  No hemorrhage, mass lesion, or acute infarction is seen.  On bone window images, no calvarial abnormality is noted. The paranasal sinuses that are visualized are clear.  IMPRESSION: Moderate atrophy and small vessel ischemic change.  No acute intracranial abnormality.  CT MAXILLOFACIAL  Findings:  No maxillofacial fracture is seen.  The orbital rims and zygomatic arches are intact.  No nasal bone fracture is seen. There is a probable retention cyst noted in the anterior left maxillary sinus, but no sinusitis is seen.  The nasal turbinates are normal in size and position.  IMPRESSION: No maxillofacial fracture is seen.  The sinuses are clear.  CT CERVICAL SPINE  Findings:   The cervical vertebrae are in normal alignment.  There is degenerative disc disease primarily at C4-5, C5-6, and C6-7 levels with loss of disc space and spurring.  Posterior osteophytes at C5-6 and C6-7 levels somewhat narrow the central canal.  No acute  fracture is seen.  The odontoid process is  intact.  There are diffuse degenerative changes throughout the facet joints of the cervical spine.  Foraminal narrowing is noted at multiple levels as well.  The thyroid gland is unremarkable.  IMPRESSION:  1.  Diffuse degenerative disc disease with some foraminal narrowing and mild central canal narrowing at several levels.  2.  Osteopenia.  No acute fracture.   Original Report Authenticated By: Dwyane Dee, M.D.    Microbiology: Recent Results (from the past 240 hour(s))  URINE CULTURE     Status: None   Collection Time    01/01/13  9:04 PM      Result Value Range Status   Specimen Description URINE, CLEAN CATCH   Final   Special Requests NONE   Final   Culture  Setup Time 01/02/2013 03:03   Final   Colony Count 50,000 COLONIES/ML   Final   Culture [facility] MORGANII   Final   Report Status 01/04/2013 FINAL   Final   Organism ID, Bacteria MORGANELLA MORGANII   Final     Labs: Basic Metabolic Panel:  Recent Labs Lab 01/01/13 1311 01/02/13 0529 01/03/13 0525 01/04/13 0507 01/05/13 0425  NA 138 140  --   --   --   K 3.5 4.1  --   --   --   CL 101 104  --   --   --   CO2  --  30  --   --   --   GLUCOSE 105* 105*  --   --   --   BUN 19 14  --   --   --   CREATININE 0.80 0.95  --   --   --   CALCIUM  --  8.5  --   --   --   MG  --  2.1 1.9 1.8 1.9   Liver Function Tests:  Recent Labs Lab 01/01/13 1435  AST 34  ALT 26  ALKPHOS 90  BILITOT 0.8  PROT 6.6  ALBUMIN 3.5   No results found for this basename: LIPASE, AMYLASE,  in the last 168 hours No results found for this basename: AMMONIA,  in the last 168 hours CBC:  Recent Labs Lab 01/01/13 1305 01/01/13 1311 01/02/13 0529  WBC 4.3  --  4.4  NEUTROABS 3.4  --   --   HGB 14.8 14.6 12.6*  HCT 40.7 43.0 36.1*  MCV 92.1  --  91.9  PLT 160  --  132*   Cardiac Enzymes:  Recent Labs Lab 01/01/13 1300  CKTOTAL 79   BNP: BNP (last 3 results) No results found for  this basename: PROBNP,  in the last 8760 hours CBG:  Recent Labs Lab 01/04/13 0750 01/04/13 1146 01/04/13 1708 01/04/13 2054 01/05/13 0715  GLUCAP 76 73 107* 153* 80       Signed:  Julianna Vanwagner  [facility] 01/05/2013, 9:40 AM

## 2013-01-07 ENCOUNTER — Encounter: Payer: Self-pay | Admitting: Nurse Practitioner

## 2013-01-07 ENCOUNTER — Non-Acute Institutional Stay (SKILLED_NURSING_FACILITY): Payer: Medicare Other | Admitting: Nurse Practitioner

## 2013-01-07 DIAGNOSIS — N390 Urinary tract infection, site not specified: Secondary | ICD-10-CM

## 2013-01-07 DIAGNOSIS — E119 Type 2 diabetes mellitus without complications: Secondary | ICD-10-CM

## 2013-01-07 DIAGNOSIS — I10 Essential (primary) hypertension: Secondary | ICD-10-CM

## 2013-01-07 DIAGNOSIS — F0390 Unspecified dementia without behavioral disturbance: Secondary | ICD-10-CM

## 2013-01-07 NOTE — Progress Notes (Signed)
Patient ID: Johnny Hayden, male   DOB: 05-12-1932, 79 y.o.   MRN: [id number]   PCP: No primary provider on file.   Allergies  Allergen Reactions  . Augmentin (Amoxicillin-Pot Clavulanate) Other (See Comments)    Reaction Unknown, from Doctor's office  . Zestril (Lisinopril) Other (See Comments)    Reaction Unknown, from Doctor's office    Chief Complaint: hospital follow up  HPI:  79 y/o with dementia and diabetes that was found at his outside of his home on the ground. Reportedly was locked out of his home for 3 days.  Pt was hospitalized from 6/10-6/14/2014. During hospitalization pt was seen by Psychiatry who deemed patient unable to make his own medical decisions.  MRI showed no acute infarct but does show global atroph,  Vit b12 is elevated, TSH wnl, hgba1c wnl, Folate wnl, and rpr is non reactive, - UDS was negative  - Patient to continue PT at SNF after discharge per Physical therapist evaluation and is now at [facility] rehab and most likely for long term care pts with history of DM,  hemoglobin a1c within normal limits at 5.2, currently d plan on holding metformin and treating with diabetic diet. Pt with history of alcohol use/abuse.  Pt was in sinus bradycardia while on B blocker and heart rate has gone as low as 50 although blood pressure has been hypertensive. Will discontinue B blocker and start patient back on his hctz.  Pt was diagnosed with UTI in hospital.  Urine culture grew out morganella morganii sensitive to cipro; and was treated for 3 days  Review of Systems:  Review of Systems  Constitutional: Negative for fever and chills.  Respiratory: Negative for cough and shortness of breath.   Cardiovascular: Negative for chest pain and palpitations.  Gastrointestinal: Negative for heartburn, abdominal pain, diarrhea and constipation.  Genitourinary: Negative for dysuria, urgency and frequency.  Musculoskeletal: Negative for myalgias and back pain.  Neurological: Negative  for weakness.  Psychiatric/Behavioral: Positive for memory loss.     Past Medical History  Diagnosis Date  . Hypertension   . Diabetes mellitus without complication   . Arthritis   . Dementia    Past Surgical History  Procedure Laterality Date  . No past surgeries     Social History:   reports that he has never smoked. He has never used smokeless tobacco. He reports that  drinks alcohol. He reports that he does not use illicit drugs.  No family history on file.  Medications: Patient's Medications  New Prescriptions   No medications on file  Previous Medications   ALLOPURINOL (ZYLOPRIM) 300 MG TABLET    Take 300 mg by mouth daily.   ASCORBIC ACID (VITAMIN C) 500 MG TABLET    Take 500 mg by mouth daily.   CINNAMON PO    Take 1 capsule by mouth daily.   CIPROFLOXACIN (CIPRO) 500 MG TABLET    Take 1 tablet (500 mg total) by mouth 2 (two) times daily.   DONEPEZIL (ARICEPT) 5 MG TABLET    Take 1 tablet (5 mg total) by mouth at bedtime.   FISH OIL-OMEGA-3 FATTY ACIDS 1000 MG CAPSULE    Take 1 g by mouth daily.   FOLIC ACID (FOLVITE) 1 MG TABLET    Take 1 tablet (1 mg total) by mouth daily.   HALOPERIDOL (HALDOL) 2 MG TABLET    Take 1 tablet (2 mg total) by mouth every 6 (six) hours as needed.   HYDROCHLOROTHIAZIDE (HYDRODIURIL) 12.5 MG TABLET  Take 2 tablets (25 mg total) by mouth daily.   MULTIPLE VITAMIN (MULTIVITAMIN WITH MINERALS) TABS    Take 1 tablet by mouth daily.   THIAMINE (VITAMIN B-1) 100 MG TABLET    Take 100 mg by mouth daily.   VITAMIN B-12 (CYANOCOBALAMIN) 1000 MCG TABLET    Take 1,000 mcg by mouth daily.  Modified Medications   No medications on file  Discontinued Medications   No medications on file     Physical Exam: Physical Exam  Nursing note and vitals reviewed. Constitutional: He is well-developed, well-nourished, and in no distress. No distress.  HENT:  Mouth/Throat: Oropharynx is clear and moist.  Abrasion above eyebrow - no signs of edema or  infection  Eyes: Conjunctivae and EOM are normal. Pupils are equal, round, and reactive to light.  Neck: Normal range of motion. Neck supple. No thyromegaly present.  Cardiovascular: Normal rate, regular rhythm and normal heart sounds.   Pulmonary/Chest: Effort normal and breath sounds normal. No respiratory distress.  Abdominal: Soft. Bowel sounds are normal.  Musculoskeletal: Normal range of motion. He exhibits no edema and no tenderness.  Neurological: He is alert.  Skin: Skin is warm and dry. He is not diaphoretic.     Filed Vitals:   01/07/13 0941  BP: 157/83  Pulse: 56  Temp: 97.6 F (36.4 C)  Resp: 18      Labs reviewed: Basic Metabolic Panel:  Recent Labs  40/98/11 1311  01/02/13 0529 01/03/13 0525 01/04/13 0507 01/05/13 0425  NA 138  --  140  --   --   --   K 3.5  --  4.1  --   --   --   CL 101  --  104  --   --   --   CO2  --   --  30  --   --   --   GLUCOSE 105*  --  105*  --   --   --   BUN 19  --  14  --   --   --   CREATININE 0.80  --  0.95  --   --   --   CALCIUM  --   --  8.5  --   --   --   MG  --   < > 2.1 1.9 1.8 1.9  < > = values in this interval not displayed. Liver Function Tests:  Recent Labs  01/01/13 1435  AST 34  ALT 26  ALKPHOS 90  BILITOT 0.8  PROT 6.6  ALBUMIN 3.5   No results found for this basename: LIPASE, AMYLASE,  in the last 8760 hours No results found for this basename: AMMONIA,  in the last 8760 hours CBC:  Recent Labs  01/01/13 1305 01/01/13 1311 01/02/13 0529  WBC 4.3  --  4.4  NEUTROABS 3.4  --   --   HGB 14.8 14.6 12.6*  HCT 40.7 43.0 36.1*  MCV 92.1  --  91.9  PLT 160  --  132*   Cardiac Enzymes:  Recent Labs  01/01/13 1300  CKTOTAL 79   BNP: No components found with this basename: POCBNP,  CBG:  Recent Labs  01/04/13 1708 01/04/13 2054 01/05/13 0715  GLUCAP 107* 153* 80     Assessment/Plan   1.   Dementia, without behavioral disturbance 294.20     With history of ETOH abuse;  Currently stable   2.   Diabetes mellitus without complication 250.00  Patient is stable; continue current regimen. Will monitor and make changes as needed   3.   Hypertension 401.9     Patient is stable; continue current medications   4.   UTI (urinary tract infection)   Completed anabiotics No complaints of dysuria

## 2013-01-16 ENCOUNTER — Encounter: Payer: Self-pay | Admitting: Nurse Practitioner

## 2013-01-16 ENCOUNTER — Non-Acute Institutional Stay (SKILLED_NURSING_FACILITY): Payer: Medicare Other | Admitting: Nurse Practitioner

## 2013-01-16 DIAGNOSIS — F0391 Unspecified dementia with behavioral disturbance: Secondary | ICD-10-CM

## 2013-01-16 DIAGNOSIS — B372 Candidiasis of skin and nail: Secondary | ICD-10-CM

## 2013-01-16 DIAGNOSIS — R5381 Other malaise: Secondary | ICD-10-CM

## 2013-01-16 DIAGNOSIS — R5383 Other fatigue: Secondary | ICD-10-CM

## 2013-01-16 NOTE — Progress Notes (Signed)
Patient ID: Johnny Hayden, male   DOB: 09/30/1931, 79 y.o.   MRN: [id number]  Nursing Home Location:  [facility]   Place of Service: SNF (31)   Chief Complaint: acute visit  HPI:  79 year old male at [place]- staff request to be seen today because he has increase agitation and decrease memory. Pt reports he is not motivated to do anything and just has an overall fatigue. Denies fevers or chill, shortness of breath or chest pains. Denies pains.  Review of Systems:  Review of Systems  Constitutional: Positive for malaise/fatigue. Negative for fever and chills.  HENT: Negative for congestion and sore throat.   Respiratory: Negative for shortness of breath.   Cardiovascular: Negative for chest pain.  Gastrointestinal: Negative for abdominal pain, diarrhea and constipation.  Genitourinary: Negative for dysuria, urgency and frequency.  Musculoskeletal: Negative for myalgias.  Skin:       Itchy feet  Neurological: Negative for weakness and headaches.  Psychiatric/Behavioral: Positive for depression and memory loss.    Medications: Patient's Medications  New Prescriptions   No medications on file  Previous Medications   ALLOPURINOL (ZYLOPRIM) 300 MG TABLET    Take 300 mg by mouth daily.   ASCORBIC ACID (VITAMIN C) 500 MG TABLET    Take 500 mg by mouth daily.   CINNAMON PO    Take 1 capsule by mouth daily.   DONEPEZIL (ARICEPT) 5 MG TABLET    Take 1 tablet (5 mg total) by mouth at bedtime.   FISH OIL-OMEGA-3 FATTY ACIDS 1000 MG CAPSULE    Take 1 g by mouth daily.   FOLIC ACID (FOLVITE) 1 MG TABLET    Take 1 tablet (1 mg total) by mouth daily.   HALOPERIDOL (HALDOL) 2 MG TABLET    Take 1 tablet (2 mg total) by mouth every 6 (six) hours as needed.   HYDROCHLOROTHIAZIDE (HYDRODIURIL) 12.5 MG TABLET    Take 2 tablets (25 mg total) by mouth daily.   MULTIPLE VITAMIN (MULTIVITAMIN WITH MINERALS) TABS    Take 1 tablet by mouth daily.   THIAMINE (VITAMIN B-1) 100 MG TABLET     Take 100 mg by mouth daily.   VITAMIN B-12 (CYANOCOBALAMIN) 1000 MCG TABLET    Take 1,000 mcg by mouth daily.  Modified Medications   No medications on file  Discontinued Medications   CIPROFLOXACIN (CIPRO) 500 MG TABLET    Take 1 tablet (500 mg total) by mouth 2 (two) times daily.     Physical Exam:  Filed Vitals:   01/16/13 1432  BP: 128/64  Pulse: 86  Temp: 98 F (36.7 C)  Resp: 18    Physical Exam  Nursing note and vitals reviewed. Constitutional: He is well-developed, well-nourished, and in no distress. No distress.  HENT:  Head: Normocephalic and atraumatic.  Eyes: Conjunctivae and EOM are normal. Pupils are equal, round, and reactive to light.  Neck: Normal range of motion. Neck supple.  Cardiovascular: Normal rate, regular rhythm and normal heart sounds.   Pulmonary/Chest: Effort normal and breath sounds normal.  Abdominal: Soft. Bowel sounds are normal. He exhibits no distension. There is no tenderness.  Musculoskeletal: Normal range of motion.  Neurological: He is alert.  Skin: Skin is warm and dry. Rash (bilateral feet) noted. He is not diaphoretic.    Assessment/Plan  1.   Dementia, with behavioral disturbance 294.21     Still start namenda Xr titration    2.   Other malaise and fatigue  780.79     Will get cbc with diff, cmp, tsh   3.   Candidal skin infection   mycolog cream BID to bilateral feet for 7 days to keep area clean and dry

## 2013-01-18 NOTE — Progress Notes (Signed)
Date: 01/18/2013  MRN:  [id number] Name:  Johnny Hayden Sex:  male Age:  79 y.o. DOB:28-May-1932                    Facility/Room; [facility] Level Of Care:SNF Provider: Dr. Murray Hodgkins   Emergency Contacts: Contact Information   Name Relation Home Work Mobile   [place],[name] Grandaughter   [phone]      Code Status:Full Code MOST Form:  Allergies: Allergies  Allergen Reactions  . Augmentin (Amoxicillin-Pot Clavulanate) Other (See Comments)    Reaction Unknown, from Doctor's office  . Zestril (Lisinopril) Other (See Comments)    Reaction Unknown, from Doctor's office    Chief Complaint  Patient presents with  . Medical Managment of Chronic Issues    New admit to SNF following hospitalization for syncope     HPI: Johnny Hayden is a 79 y.o. male, with history of advanced dementia and a very poor historian, 2 diabetes mellitus, hypertension, arthritis, who lives alone and according to his sister who are talked to over the phone is in poor state of health, according to the EMS report neighbors reported that patient was found lying in his front yard and was apparently locked out of his house for the past few days, patient himself is quite confused however he says that he has been lightheaded and falling over and over again for the last several days, he does not remember when he walked out of his house, he says he has multiple injuries all over but he mostly hurts over his right hip, when EMS found him he was lying in his front yard, he was found to have extensive bruising all over, he was also found to be hypoglycemic with a CBG of 56, he was then brought to the hospital for recurrent syncope, recurrent falls, hypoglycemia, in the ER his EKG was suggestive of possible atrial flutter versus sinus rhythm with artifact and PVCs, patient was admitted 01/01/2013 for recurrent syncope with falls, hyperglycemia, possible atrial flutter of new origin. Psychiatry has deemed patient unable  to make his own medical decisions.  Social worker reports that lady identifying herself as patient's sister was an old girl friend. Currently grand daughter is making medical decisions for patient and wishes to transition patient to SNF. Patient was discharged on 01/05/2013 to [facility].     Past Medical History  Diagnosis Date  . Hypertension   . Diabetes mellitus without complication   . Arthritis   . Dementia     Past Surgical History  Procedure Laterality Date  . No past surgeries       Procedures: 01/01/2013  CHEST - 1 VIEW IMPRESSION: No active disease.   01/01/2013 RIGHT HIP - COMPLETE 2+ VIEW  IMPRESSION: Degenerative changes of the pelvis and right hip without evidence for acute fracture or dislocation.   01/01/2013  CT HEAD WITHOUT CONTRAST CT MAXILLOFACIAL WITHOUT CONTRAST CT CERVICAL SPINE WITHOUT CONTRAST Technique: Multidetector CT imaging of the head, cervical spine, and maxillofacial structures were performed using the standard protocol without intravenous contrast. Multiplanar CT image reconstructions of the cervical spine and maxillofacial structures were also generated. Comparison: None CT HEAD Findings: The ventricular system is prominent, as are the cortical sulci diffusely thickened, consistent with atrophy. Moderate small vessel ischemic change is noted throughout the periventricular white matter. No hemorrhage, mass lesion, or acute infarction is seen. On bone window images, no calvarial abnormality is noted. The paranasal sinuses that are visualized are clear. IMPRESSION: Moderate  atrophy and small vessel ischemic change. No acute intracranial abnormality. CT MAXILLOFACIAL Findings: No maxillofacial fracture is seen. The orbital rims and zygomatic arches are intact. No nasal bone fracture is seen. There is a probable retention cyst noted in the anterior left maxillary sinus, but no sinusitis is seen. The nasal turbinates are normal in size and position. IMPRESSION: No  maxillofacial fracture is seen. The sinuses are clear. CT CERVICAL SPINE Findings: The cervical vertebrae are in normal alignment. There is degenerative disc disease primarily at C4-5, C5-6, and C6-7 levels with loss of disc space and spurring. Posterior osteophytes at C5-6 and C6-7 levels somewhat narrow the central canal. No acute fracture is seen. The odontoid process is intact. There are diffuse degenerative changes throughout the facet joints of the cervical spine. Foraminal narrowing is noted at multiple levels as well. The thyroid gland is unremarkable. IMPRESSION: 1. Diffuse degenerative disc disease with some foraminal narrowing and mild central canal narrowing at several levels. 2. Osteopenia. No acute fracture.   01/01/2013  CT HEAD WITHOUT CONTRAST CT MAXILLOFACIAL WITHOUT CONTRAST CT CERVICAL SPINE WITHOUT CONTRAST Technique: Multidetector CT imaging of the head, cervical spine, and maxillofacial structures were performed using the standard protocol without intravenous contrast. Multiplanar CT image reconstructions of the cervical spine and maxillofacial structures were also generated. Comparison: None CT HEAD Findings: The ventricular system is prominent, as are the cortical sulci diffusely thickened, consistent with atrophy. Moderate small vessel ischemic change is noted throughout the periventricular white matter. No hemorrhage, mass lesion, or acute infarction is seen. On bone window images, no calvarial abnormality is noted. The paranasal sinuses that are visualized are clear. IMPRESSION: Moderate atrophy and small vessel ischemic change. No acute intracranial abnormality. CT MAXILLOFACIAL Findings: No maxillofacial fracture is seen. The orbital rims and zygomatic arches are intact. No nasal bone fracture is seen. There is a probable retention cyst noted in the anterior left maxillary sinus, but no sinusitis is seen. The nasal turbinates are normal in size and position. IMPRESSION: No  maxillofacial fracture is seen. The sinuses are clear. CT CERVICAL SPINE Findings: The cervical vertebrae are in normal alignment. There is degenerative disc disease primarily at C4-5, C5-6, and C6-7 levels with loss of disc space and spurring. Posterior osteophytes at C5-6 and C6-7 levels somewhat narrow the central canal. No acute fracture is seen. The odontoid process is intact. There are diffuse degenerative changes throughout the facet joints of the cervical spine. Foraminal narrowing is noted at multiple levels as well. The thyroid gland is unremarkable. IMPRESSION: 1. Diffuse degenerative disc disease with some foraminal narrowing and mild central canal narrowing at several levels. 2. Osteopenia. No acute fracture.   01/01/2013  MRI HEAD WITHOUT CONTRAST  IMPRESSION: No acute infarct. Right lateral orbital subcutaneous hematoma. No intracranial hemorrhage. Prominent small vessel disease type changes. Global atrophy. Ventricular prominence possibly related to atrophy although superimposed mild hydrocephalus not excluded. Overall appearance without change.   01/01/2013 CT HEAD WITHOUT CONTRAST CT MAXILLOFACIAL WITHOUT CONTRAST CT CERVICAL SPINE WITHOUT CONTRAST  IMPRESSION: Moderate atrophy and small vessel ischemic change. No acute intracranial abnormality. CT MAXILLOFACIAL Findings: No maxillofacial fracture is seen. The orbital rims and zygomatic arches are intact. No nasal bone fracture is seen. There is a probable retention cyst noted in the anterior left maxillary sinus, but no sinusitis is seen. The nasal turbinates are normal in size and position. IMPRESSION: No maxillofacial fracture is seen. The sinuses are clear. CT CERVICAL SPINE Findings: The cervical vertebrae are in normal alignment.  There is degenerative disc disease primarily at C4-5, C5-6, and C6-7 levels with loss of disc space and spurring. Posterior osteophytes at C5-6 and C6-7 levels somewhat narrow the central canal. No acute fracture  is seen. The odontoid process is intact. There are diffuse degenerative changes throughout the facet joints of the cervical spine. Foraminal narrowing is noted at multiple levels as well. The thyroid gland is unremarkable. IMPRESSION: 1. Diffuse degenerative disc disease with some foraminal narrowing and mild central canal narrowing at several levels. 2. Osteopenia. No acute fracture. O   Consultants:  Dr. Elsie Saas Psychiatry  Current Outpatient Prescriptions  Medication Sig Dispense Refill  . allopurinol (ZYLOPRIM) 300 MG tablet Take 300 mg by mouth daily.      Marland Kitchen ascorbic acid (VITAMIN C) 500 MG tablet Take 500 mg by mouth daily.      Marland Kitchen CINNAMON PO Take 1 capsule by mouth daily.      Marland Kitchen donepezil (ARICEPT) 5 MG tablet Take 1 tablet (5 mg total) by mouth at bedtime.  30 tablet  0  . fish oil-omega-3 fatty acids 1000 MG capsule Take 1 g by mouth daily.      . folic acid (FOLVITE) 1 MG tablet Take 1 tablet (1 mg total) by mouth daily.  30 tablet  0  . haloperidol (HALDOL) 2 MG tablet Take 1 tablet (2 mg total) by mouth every 6 (six) hours as needed.  30 tablet  0  . hydrochlorothiazide (HYDRODIURIL) 12.5 MG tablet Take 2 tablets (25 mg total) by mouth daily.  30 tablet  0  . Multiple Vitamin (MULTIVITAMIN WITH MINERALS) TABS Take 1 tablet by mouth daily.  30 tablet  0  . thiamine (VITAMIN B-1) 100 MG tablet Take 100 mg by mouth daily.      . vitamin B-12 (CYANOCOBALAMIN) 1000 MCG tablet Take 1,000 mcg by mouth daily.       No current facility-administered medications for this visit.     There is no immunization history on file for this patient.   Diet:  History  Substance Use Topics  . Smoking status: Never Smoker   . Smokeless tobacco: Never Used  . Alcohol Use: Yes     Comment: "too much sometimes"    No family history on file.     Vital signs: BP 147/68  Pulse 53  Resp 20  Ht 5\' 8"  (1.727 m)  Wt 138 lb 12.8 oz (62.959 kg)  BMI 21.11 kg/m2  General Appearance:     Alert, cooperative, no distress, appears stated age  Head:    Normocephalic, without obvious abnormality, atraumatic  Eyes:    PERRL, conjunctiva/corneas clear, EOM's intact, fundi    benign, both eyes       Ears:    Normal TM's and external ear canals, both ears  Nose:   Nares normal, septum midline, mucosa normal, no drainage   or sinus tenderness  Throat:   Lips, mucosa, and tongue normal; teeth and gums normal  Neck:   Supple, symmetrical, trachea midline, no adenopathy;       thyroid:  No enlargement/tenderness/nodules; no carotid   bruit or JVD  Back:     Symmetric, no curvature, ROM normal, no CVA tenderness  Lungs:     Clear to auscultation bilaterally, respirations unlabored  Chest wall:    No tenderness or deformity  Heart:    Regular rate and rhythm, S1 and S2 normal, no murmur, rub   or gallop  Abdomen:  Soft, non-tender, bowel sounds active all four quadrants,    no masses, no organomegaly  Genitalia:    Normal male without lesion, discharge or tenderness  Rectal:    Normal tone, normal prostate, no masses or tenderness;   guaiac negative stool  Extremities:   Extremities normal, atraumatic, no cyanosis or edema  Pulses:   2+ and symmetric all extremities  Skin:   Skin color, texture, turgor normal, no rashes or lesions  Lymph nodes:   Cervical, supraclavicular, and axillary nodes normal  Neurologic:   CNII-XII intact. Normal strength, sensation and reflexes      throughout   Screening Score  MMS    PHQ2    PHQ9     Fall Risk    BIMS     Admission on 01/01/2013, Discharged on 01/05/2013  Component Date Value Range Status  . WBC 01/01/2013 4.3  4.0 - 10.5 K/uL Final  . RBC 01/01/2013 4.42  4.22 - 5.81 MIL/uL Final  . Hemoglobin 01/01/2013 14.8  13.0 - 17.0 g/dL Final  . HCT 16/04/9603 40.7  39.0 - 52.0 % Final  . MCV 01/01/2013 92.1  78.0 - 100.0 fL Final  . MCH 01/01/2013 33.5  26.0 - 34.0 pg Final  . MCHC 01/01/2013 36.4* 30.0 - 36.0 g/dL Final  . RDW  54/03/8118 12.4  11.5 - 15.5 % Final  . Platelets 01/01/2013 160  150 - 400 K/uL Final  . Neutrophils Relative % 01/01/2013 79* 43 - 77 % Final  . Neutro Abs 01/01/2013 3.4  1.7 - 7.7 K/uL Final  . Lymphocytes Relative 01/01/2013 15  12 - 46 % Final  . Lymphs Abs 01/01/2013 0.6* 0.7 - 4.0 K/uL Final  . Monocytes Relative 01/01/2013 6  3 - 12 % Final  . Monocytes Absolute 01/01/2013 0.3  0.1 - 1.0 K/uL Final  . Eosinophils Relative 01/01/2013 0  0 - 5 % Final  . Eosinophils Absolute 01/01/2013 0.0  0.0 - 0.7 K/uL Final  . Basophils Relative 01/01/2013 0  0 - 1 % Final  . Basophils Absolute 01/01/2013 0.0  0.0 - 0.1 K/uL Final  . Color, Urine 01/01/2013 YELLOW  YELLOW Final  . APPearance 01/01/2013 CLEAR  CLEAR Final  . Specific Gravity, Urine 01/01/2013 1.018  1.005 - 1.030 Final  . pH 01/01/2013 6.0  5.0 - 8.0 Final  . Glucose, UA 01/01/2013 NEGATIVE  NEGATIVE mg/dL Final  . Hgb urine dipstick 01/01/2013 NEGATIVE  NEGATIVE Final  . Bilirubin Urine 01/01/2013 NEGATIVE  NEGATIVE Final  . Ketones, ur 01/01/2013 40* NEGATIVE mg/dL Final  . Protein, ur 14/78/2956 NEGATIVE  NEGATIVE mg/dL Final  . Urobilinogen, UA 01/01/2013 1.0  0.0 - 1.0 mg/dL Final  . Nitrite 21/30/8657 NEGATIVE  NEGATIVE Final  . Leukocytes, UA 01/01/2013 NEGATIVE  NEGATIVE Final   MICROSCOPIC NOT DONE ON URINES WITH NEGATIVE PROTEIN, BLOOD, LEUKOCYTES, NITRITE, OR GLUCOSE <1000 mg/dL.  Marland Kitchen Glucose-Capillary 01/01/2013 67* 70 - 99 mg/dL Final  . Sodium 84/69/6295 138  135 - 145 mEq/L Final  . Potassium 01/01/2013 3.5  3.5 - 5.1 mEq/L Final  . Chloride 01/01/2013 101  96 - 112 mEq/L Final  . BUN 01/01/2013 19  6 - 23 mg/dL Final  . Creatinine, Ser 01/01/2013 0.80  0.50 - 1.35 mg/dL Final  . Glucose, Bld 28/41/3244 105* 70 - 99 mg/dL Final  . Calcium, Ion 07/27/7251 1.13  1.13 - 1.30 mmol/L Final  . TCO2 01/01/2013 23  0 - 100 mmol/L Final  . Hemoglobin  01/01/2013 14.6  13.0 - 17.0 g/dL Final  . HCT 62/95/2841 43.0  39.0  - 52.0 % Final  . Total Protein 01/01/2013 6.6  6.0 - 8.3 g/dL Final  . Albumin 32/44/0102 3.5  3.5 - 5.2 g/dL Final  . AST 72/53/6644 34  0 - 37 U/L Final  . ALT 01/01/2013 26  0 - 53 U/L Final  . Alkaline Phosphatase 01/01/2013 90  39 - 117 U/L Final  . Total Bilirubin 01/01/2013 0.8  0.3 - 1.2 mg/dL Final  . Bilirubin, Direct 01/01/2013 0.3  0.0 - 0.3 mg/dL Final  . Indirect Bilirubin 01/01/2013 0.5  0.3 - 0.9 mg/dL Final  . Alcohol, Ethyl (B) 01/01/2013 <11  0 - 11 mg/dL Final   Comment:                                 LOWEST DETECTABLE LIMIT FOR                          SERUM ALCOHOL IS 11 mg/dL                          FOR MEDICAL PURPOSES ONLY  . Total CK 01/01/2013 79  7 - 232 U/L Final  . Hemoglobin A1C 01/01/2013 5.2  <5.7 % Corrected   Comment: (NOTE)                                                                                                                         According to the ADA Clinical Practice Recommendations for 2011, when                          HbA1c is used as a screening test:                           >=6.5%   Diagnostic of Diabetes Mellitus                                    (if abnormal result is confirmed)                          5.7-6.4%   Increased risk of developing Diabetes Mellitus                          References:Diagnosis and Classification of Diabetes Mellitus,Diabetes                          Care,2011,34(Suppl 1):S62-S69 and Standards of Medical Care in  Diabetes - 2011,Diabetes Care,2011,34 (Suppl 1):S11-S61.                          CORRECTED ON 06/11 AT 1610: PREVIOUSLY REPORTED AS 5.2 Reference range: <5.7  . Mean Plasma Glucose 01/01/2013 103  <117 mg/dL Corrected  . Opiates 01/01/2013 NONE DETECTED  NONE DETECTED Final  . Cocaine 01/01/2013 NONE DETECTED  NONE DETECTED Final  . Benzodiazepines 01/01/2013 NONE DETECTED  NONE DETECTED Final  . Amphetamines 01/01/2013 NONE DETECTED  NONE DETECTED Final   . Tetrahydrocannabinol 01/01/2013 NONE DETECTED  NONE DETECTED Final  . Barbiturates 01/01/2013 NONE DETECTED  NONE DETECTED Final   Comment:                                 DRUG SCREEN FOR MEDICAL PURPOSES                          ONLY.  IF CONFIRMATION IS NEEDED                          FOR ANY PURPOSE, NOTIFY LAB                          WITHIN 5 DAYS.                                                          LOWEST DETECTABLE LIMITS                          FOR URINE DRUG SCREEN                          Drug Class       Cutoff (ng/mL)                          Amphetamine      1000                          Barbiturate      200                          Benzodiazepine   200                          Tricyclics       300                          Opiates          300                          Cocaine          300                          THC  50  . C-Peptide 01/01/2013 1.62  0.80 - 3.90 ng/mL Final  . TSH 01/01/2013 0.770  0.350 - 4.500 uIU/mL Final  . Specimen Description 01/01/2013 URINE, CLEAN CATCH   Final  . Special Requests 01/01/2013 NONE   Final  . Culture  Setup Time 01/01/2013 01/02/2013 03:03   Final  . Colony Count 01/01/2013 50,000 COLONIES/ML   Final  . Culture 01/01/2013 MORGANELLA MORGANII   Final  . Report Status 01/01/2013 01/04/2013 FINAL   Final  . Organism ID, Bacteria 01/01/2013 MORGANELLA MORGANII   Final  . Vitamin B-12 01/01/2013 1283* 211 - 911 pg/mL Final  . Glucose-Capillary 01/01/2013 138* 70 - 99 mg/dL Final  . Sodium 95/28/4132 140  135 - 145 mEq/L Final  . Potassium 01/02/2013 4.1  3.5 - 5.1 mEq/L Final  . Chloride 01/02/2013 104  96 - 112 mEq/L Final  . CO2 01/02/2013 30  19 - 32 mEq/L Final  . Glucose, Bld 01/02/2013 105* 70 - 99 mg/dL Final  . BUN 44/07/270 14  6 - 23 mg/dL Final  . Creatinine, Ser 01/02/2013 0.95  0.50 - 1.35 mg/dL Final  . Calcium 53/66/4403 8.5  8.4 - 10.5 mg/dL Final  . GFR calc non Af Amer 01/02/2013 78* >90  mL/min Final  . GFR calc Af Amer 01/02/2013 >90  >90 mL/min Final   Comment:                                 The eGFR has been calculated                          using the CKD EPI equation.                          This calculation has not been                          validated in all clinical                          situations.                          eGFR's persistently                          <90 mL/min signify                          possible Chronic Kidney Disease.  . WBC 01/02/2013 4.4  4.0 - 10.5 K/uL Final  . RBC 01/02/2013 3.93* 4.22 - 5.81 MIL/uL Final  . Hemoglobin 01/02/2013 12.6* 13.0 - 17.0 g/dL Final   PREVIOUS RESULT FROM AN ISTAT.  Marland Kitchen HCT 01/02/2013 36.1* 39.0 - 52.0 % Final  . MCV 01/02/2013 91.9  78.0 - 100.0 fL Final  . MCH 01/02/2013 32.1  26.0 - 34.0 pg Final  . MCHC 01/02/2013 34.9  30.0 - 36.0 g/dL Final  . RDW 47/42/5956 12.5  11.5 - 15.5 % Final  . Platelets 01/02/2013 132* 150 - 400 K/uL Final  . Glucose-Capillary 01/01/2013 114* 70 - 99 mg/dL Final  . Magnesium 38/75/6433 2.1  1.5 - 2.5 mg/dL Final  . Glucose-Capillary 01/02/2013 103* 70 - 99  mg/dL Final  . Glucose-Capillary 01/01/2013 124* 70 - 99 mg/dL Final  . Comment 1 16/04/9603 Documented in Chart   Final  . Comment 2 01/01/2013 Notify RN   Final  . Folate 01/02/2013 >20.0   Final   Comment: (NOTE)                          Reference Ranges                                 Deficient:       0.4 - 3.3 ng/mL                                 Indeterminate:   3.4 - 5.4 ng/mL                                 Normal:              > 5.4 ng/mL  . RPR 01/02/2013 NON REACTIVE  NON REACTIVE Final  . Glucose-Capillary 01/02/2013 115* 70 - 99 mg/dL Final  . Glucose-Capillary 01/02/2013 110* 70 - 99 mg/dL Final  . Comment 1 54/03/8118 Documented in Chart   Final  . Comment 2 01/02/2013 Notify RN   Final  . Magnesium 01/03/2013 1.9  1.5 - 2.5 mg/dL Final  . Glucose-Capillary 01/02/2013 138* 70 - 99 mg/dL  Final  . Glucose-Capillary 01/03/2013 98  70 - 99 mg/dL Final  . Glucose-Capillary 01/03/2013 160* 70 - 99 mg/dL Final  . Glucose-Capillary 01/03/2013 138* 70 - 99 mg/dL Final  . Magnesium 14/78/2956 1.8  1.5 - 2.5 mg/dL Final  . Glucose-Capillary 01/03/2013 190* 70 - 99 mg/dL Final  . Glucose-Capillary 01/04/2013 76  70 - 99 mg/dL Final  . Glucose-Capillary 01/04/2013 73  70 - 99 mg/dL Final  . Magnesium 21/30/8657 1.9  1.5 - 2.5 mg/dL Final  . Glucose-Capillary 01/04/2013 107* 70 - 99 mg/dL Final  . Comment 1 84/69/6295 Documented in Chart   Final  . Comment 2 01/04/2013 Notify RN   Final  . Glucose-Capillary 01/04/2013 153* 70 - 99 mg/dL Final  . Comment 1 28/41/3244 Notify RN   Final  . Glucose-Capillary 01/05/2013 80  70 - 99 mg/dL Final     Annual summary: Hospitalizations:  Infection History:  Functional assessment: Areas of potential improvement: Rehabilitation Potential: Prognosis for survival: Plan:  This encounter was created in error - please disregard.

## 2013-02-04 ENCOUNTER — Non-Acute Institutional Stay (SKILLED_NURSING_FACILITY): Payer: Medicare Other | Admitting: Nurse Practitioner

## 2013-02-04 DIAGNOSIS — F411 Generalized anxiety disorder: Secondary | ICD-10-CM

## 2013-02-04 DIAGNOSIS — F0391 Unspecified dementia with behavioral disturbance: Secondary | ICD-10-CM

## 2013-02-04 DIAGNOSIS — I10 Essential (primary) hypertension: Secondary | ICD-10-CM

## 2013-02-04 NOTE — Progress Notes (Signed)
Patient ID: [facility], male   DOB: August 05, 1931, 79 y.o.   MRN: [id number]  Nursing Home [facility]:  [facility]   [facility] of Service: SN[facility] (31)  Chief Complaint  Patient presents with  . Acute Visit    increase in "behaviors"    HPI:  79 year old male who was hospitalized after being found down at home was deemed incompetent to make his own decisions due to advanced memory loss and is now at [facility] wanted to go back home and does not understand why he has not stay there. Staff reports frequent agitation and needing to be redirected.   Review of Systems:  Review of Systems  Constitutional: Negative for fever, chills and weight loss.  HENT: Negative for tinnitus.   Respiratory: Negative for cough and shortness of breath.   Cardiovascular: Negative for chest pain, palpitations and leg swelling.  Gastrointestinal: Negative for heartburn, abdominal pain, diarrhea and constipation.  Genitourinary: Negative for dysuria, urgency and frequency.  Musculoskeletal: Negative.   Skin: Negative.   Neurological: Negative for dizziness, weakness and headaches.  Psychiatric/Behavioral: Positive for memory loss. The patient is nervous/anxious.      Medications: Patient's Medications  New Prescriptions   No medications on file  Previous Medications   ALLOPURINOL (ZYLOPRIM) 300 MG TABLET    Take 300 mg by mouth daily.   ASCORBIC ACID (VITAMIN C) 500 MG TABLET    Take 500 mg by mouth daily.   CINNAMON PO    Take 1 capsule by mouth daily.   DONEPEZIL (ARICEPT) 5 MG TABLET    Take 1 tablet (5 mg total) by mouth at bedtime.   FISH OIL-OMEGA-3 FATTY ACIDS 1000 MG CAPSULE    Take 1 g by mouth daily.   FOLIC ACID (FOLVITE) 1 MG TABLET    Take 1 tablet (1 mg total) by mouth daily.   HALOPERIDOL (HALDOL) 2 MG TABLET    Take 1 tablet (2 mg total) by mouth every 6 (six) hours as needed.   HYDROCHLOROTHIAZIDE (HYDRODIURIL) 12.5 MG TABLET    Take 2 tablets (25 mg total) by mouth daily.   MULTIPLE VITAMIN (MULTIVITAMIN WITH MINERALS) TABS    Take 1 tablet by mouth daily.   THIAMINE (VITAMIN B-1) 100 MG TABLET    Take 100 mg by mouth daily.   VITAMIN B-12 (CYANOCOBALAMIN) 1000 MCG TABLET    Take 1,000 mcg by mouth daily.  Modified Medications   No medications on file  Discontinued Medications   No medications on file     Physical Exam:  Filed Vitals:   02/04/13 1209  BP: 158/80  Pulse: 58  Temp: 97.4 F (36.3 C)  Resp: 20    Nursing note and vitals reviewed.  Constitutional: He is well-developed, well-nourished, and in no distress. No distress.  HENT: Head: Normocephalic and atraumatic.  Eyes: Conjunctivae and EOM are normal. Pupils are equal, round, and reactive to light.  Neck: Normal range of motion. Neck supple.  Cardiovascular: Normal rate, regular rhythm and normal heart sounds.  Pulmonary/Chest: Effort normal and breath sounds normal.  Abdominal: Soft. Bowel sounds are normal. He exhibits no distension. There is no tenderness.  Musculoskeletal: Normal range of motion.  Neurological: He is alert.  Skin: Skin is warm and dry.   Labs reviewed/Significant Diagnostic Results: CBC with Diff       Result: 01/28/2013 2:56 PM    ( Status: F )            WBC  6.2        4.0-10.5  K/uL  SLN       RBC  3.74     L  4.22-5.81  MIL/uL  SLN       Hemoglobin  11.9     L  13.0-17.0  g/dL  SLN       Hematocrit  34.6     L  39.0-52.0  %  SLN       MCV  92.5        78.0-100.0  fL  SLN       MCH  31.8        26.0-34.0  pg  SLN       MCHC  34.4        30.0-36.0  g/dL  SLN       RDW  16.1        11.5-15.5  %  SLN       Platelet Count  171        150-400  K/uL  SLN       Granulocyte %  64        43-77  %  SLN       Absolute Gran  4.0        1.7-7.7  K/uL  SLN       Lymph %  23        12-46  %  SLN       Absolute Lymph  1.4        0.7-4.0  K/uL  SLN       Mono %  10        3-12  %  SLN       Absolute Mono  0.6        0.1-1.0  K/uL  SLN       Eos %  2        0-5  %  SLN        Absolute Eos  0.1        0.0-0.7  K/uL  SLN       Baso %  1        0-1  %  SLN       Absolute Baso  0.0        0.0-0.1  K/uL  SLN       Smear Review  Criteria for review not met   SLN      Comprehensive Metabolic Panel       Result: 01/28/2013 3:15 PM    ( Status: F )            Sodium  144        135-145  mEq/L  SLN       Potassium  3.9        3.5-5.3  mEq/L  SLN       Chloride  109        96-112  mEq/L  SLN       CO2  26        19-32  mEq/L  SLN       Glucose  104     H  70-99  mg/dL  SLN       BUN  23        6-23  mg/dL  SLN       Creatinine  0.85        0.50-1.35  mg/dL  SLN       Bilirubin,  Total  0.4        0.3-1.2  mg/dL  SLN       Alkaline Phosphatase  86        39-117  U/L  SLN       AST/SGOT  21        0-37  U/L  SLN       ALT/SGPT  27        0-53  U/L  SLN       Total Protein  6.3        6.0-8.3  g/dL  SLN       Albumin  4.1        3.5-5.2  g/dL  SLN       Calcium  8.9        8.4-10.5  mg/dL  SLN      TSH, Ultrasensitive       Result: 01/28/2013 3:32 PM    ( Status: F )            TSH  1.408             Assessment/Plan   1.   Dementia, with behavioral disturbance 294.21   - pt with increase anxiety and worry about why he is at [facility]; needing freq redirection; on namenda titration pack to 28 Mg - tolerating well; will increase aricept to 10 mg q hs   2.   Generalized anxiety disorder 300.02   - will start citalopram 10 mg daily due to increase in anxiety and agitatin    3.    Hypertension- will add norvasc 10 mg daily for better BP control

## 2013-02-07 ENCOUNTER — Non-Acute Institutional Stay (SKILLED_NURSING_FACILITY): Payer: Medicare Other | Admitting: Nurse Practitioner

## 2013-02-07 ENCOUNTER — Encounter: Payer: Self-pay | Admitting: Nurse Practitioner

## 2013-02-07 DIAGNOSIS — F0391 Unspecified dementia with behavioral disturbance: Secondary | ICD-10-CM

## 2013-02-07 DIAGNOSIS — F411 Generalized anxiety disorder: Secondary | ICD-10-CM

## 2013-02-07 DIAGNOSIS — I10 Essential (primary) hypertension: Secondary | ICD-10-CM

## 2013-02-07 DIAGNOSIS — N390 Urinary tract infection, site not specified: Secondary | ICD-10-CM

## 2013-02-07 NOTE — Progress Notes (Signed)
Patient ID: [facility], male   DOB: 12-06-1931, 79 y.o.   MRN: [id number]  Nursing Home Location:  [facility]   Place of Service: SNF (31)  Chief Complaint  Patient presents with  . Discharge Note    HPI:  79 y/o with dementia and diabetes that was found at his outside of his home on the ground. Reportedly was locked out of his home for 3 days. Pt was hospitalized from 6/10-6/14/2014. During hospitalization pt was seen by Psychiatry who deemed patient unable to make his own medical decisions. MRI showed no acute infarct but does show global atroph, Vit b12 is elevated, TSH wnl, hgba1c wnl, Folate wnl, and rpr is non reactive and UDS was negative; pt with advanced dementia and will not be able to return home.  pt has been at [facility] for short term rehab and is now ready to be discharged to an assistive living facility.   Review of Systems:  DATA OBTAINED: from patient, nurse, medical record, family member GENERAL: Feels well no fevers, fatigue, appetite changes SKIN: No itching, rash or wounds EYES: No eye pain, redness, discharge EARS: No earache, tinnitus, change in hearing NOSE: No congestion, drainage or bleeding  MOUTH/THROAT: No mouth pain, No sore throat, No difficulty chewing or swallowing  RESPIRATORY: No cough, wheezing, SOB CARDIAC: No chest pain, palpitations, lower extremity edema  GI: No abdominal pain, No N/V/D or constipation, No heartburn or reflux  GU: No dysuria, frequency or urgency, or incontinence  MUSCULOSKELETAL: No unrelieved bone/joint pain NEUROLOGIC: Awake, alert, appropriate to situation, No change in mental status. Moves all four, no focal deficits PSYCHIATRIC: No overt anxiety or sadness. Sleeps well. No behavior issue.  AMBULATION:  independently ambulates; will use a cane at times     Medications: Patient's Medications  New Prescriptions   No medications on file  Previous Medications   ALLOPURINOL (ZYLOPRIM) 300 MG TABLET     Take 300 mg by mouth daily.   ASCORBIC ACID (VITAMIN C) 500 MG TABLET    Take 500 mg by mouth daily.   CINNAMON PO    Take 1 capsule by mouth daily.   CITALOPRAM (CELEXA) 10 MG TABLET    Take 10 mg by mouth daily.   FISH OIL-OMEGA-3 FATTY ACIDS 1000 MG CAPSULE    Take 1 g by mouth daily.   FOLIC ACID (FOLVITE) 1 MG TABLET    Take 1 tablet (1 mg total) by mouth daily.   HYDROCHLOROTHIAZIDE (HYDRODIURIL) 12.5 MG TABLET    Take 2 tablets (25 mg total) by mouth daily.   MEMANTINE HCL ER (NAMENDA XR TITRATION PACK) 7 & 14 & 21 &28 MG CP24    Take by mouth.   MULTIPLE VITAMIN (MULTIVITAMIN WITH MINERALS) TABS    Take 1 tablet by mouth daily.   THIAMINE (VITAMIN B-1) 100 MG TABLET    Take 100 mg by mouth daily.   VITAMIN B-12 (CYANOCOBALAMIN) 1000 MCG TABLET    Take 1,000 mcg by mouth daily.  Modified Medications   Modified Medication Previous Medication   DONEPEZIL (ARICEPT) 5 MG TABLET donepezil (ARICEPT) 5 MG tablet      Take 10 mg by mouth at bedtime.    Take 1 tablet (5 mg total) by mouth at bedtime.  Discontinued Medications   HALOPERIDOL (HALDOL) 2 MG TABLET    Take 1 tablet (2 mg total) by mouth every 6 (six) hours as needed.     Physical Exam:  Filed Vitals:  02/07/13 1426  BP: 122/69  Pulse: 56  Temp: 97.9 F (36.6 C)  Resp: 20  SpO2: 98%     GENERAL APPEARANCE: Alert, conversant. Appropriately groomed. No acute distress.  SKIN: No diaphoresis rash; has scab/wound on head after fall per granddaughter HEAD: Normocephalic, atraumatic  EYES: Conjunctiva/lids clear. Pupils round, reactive. EOMs intact.  EARS: External exam WNL. Hearing grossly normal.  NOSE: No deformity or discharge.  MOUTH/THROAT: Lips w/o lesions. Mouth and throat normal. Tongue moist, w/o lesion.  NECK: No thyroid tenderness, enlargement or nodule  RESPIRATORY: Breathing is even, unlabored. Lung sounds are clear   CARDIOVASCULAR: Heart RRR no murmurs, rubs or gallops. No peripheral edema.  ARTERIAL:  radial pulse 2+, DP pulse 1+ GASTROINTESTINAL: Abdomen is soft, non-tender, not distended w/ normal bowel sounds. GENITOURINARY: Bladder non tender, not distended  MUSCULOSKELETAL: No abnormal joints or musculature NEUROLOGIC: Oriented X to self . Cranial nerves 2-12 grossly intact. Moves all extremities no tremor. PSYCHIATRIC: Mood and affect appropriate to situation, history of behavioral issues   Labs reviewed/Significant Diagnostic Results: CBC with Diff       Result: 01/28/2013 2:56 PM    ( Status: F )            WBC  6.2        4.0-10.5  K/uL  SLN       RBC  3.74     L  4.22-5.81  MIL/uL  SLN       Hemoglobin  11.9     L  13.0-17.0  g/dL  SLN       Hematocrit  34.6     L  39.0-52.0  %  SLN       MCV  92.5        78.0-100.0  fL  SLN       MCH  31.8        26.0-34.0  pg  SLN       MCHC  34.4        30.0-36.0  g/dL  SLN       RDW  19.1        11.5-15.5  %  SLN       Platelet Count  171        150-400  K/uL  SLN       Granulocyte %  64        43-77  %  SLN       Absolute Gran  4.0        1.7-7.7  K/uL  SLN       Lymph %  23        12-46  %  SLN       Absolute Lymph  1.4        0.7-4.0  K/uL  SLN       Mono %  10        3-12  %  SLN       Absolute Mono  0.6        0.1-1.0  K/uL  SLN       Eos %  2        0-5  %  SLN       Absolute Eos  0.1        0.0-0.7  K/uL  SLN       Baso %  1        0-1  %  SLN       Absolute Baso  0.0        0.0-0.1  K/uL  SLN       Smear Review  Criteria for review not met   SLN      Comprehensive Metabolic Panel       Result: 01/28/2013 3:15 PM    ( Status: F )            Sodium  144        135-145  mEq/L  SLN       Potassium  3.9        3.5-5.3  mEq/L  SLN       Chloride  109        96-112  mEq/L  SLN       CO2  26        19-32  mEq/L  SLN       Glucose  104     H  70-99  mg/dL  SLN       BUN  23        6-23  mg/dL  SLN       Creatinine  0.85        0.50-1.35  mg/dL  SLN       Bilirubin, Total  0.4        0.3-1.2  mg/dL  SLN       Alkaline Phosphatase   86        39-117  U/L  SLN       AST/SGOT  21        0-37  U/L  SLN       ALT/SGPT  27        0-53  U/L  SLN       Total Protein  6.3        6.0-8.3  g/dL  SLN       Albumin  4.1        3.5-5.2  g/dL  SLN       Calcium  8.9        8.4-10.5  mg/dL  SLN      TSH, Ultrasensitive       Result: 01/28/2013 3:32 PM    ( Status: F )            TSH  1.408             Assessment/Plan 1. Generalized anxiety disorder Stable on current medications- celexa added during stay at [facility]  2. Dementia, with behavioral disturbance Stable; cont aricept and namenda   3. Hypertension Patients blood pressure is stable; continue current regimen. Will monitor and make changes as necessary.  4. UTI (urinary tract infection) resolved  pt is stable for discharge to assisted living-will need PT/OT per home health. No DME needed. R  will need to follow up with PCP within 2 weeks.

## 2013-02-08 DIAGNOSIS — M6281 Muscle weakness (generalized): Secondary | ICD-10-CM

## 2013-02-08 DIAGNOSIS — IMO0001 Reserved for inherently not codable concepts without codable children: Secondary | ICD-10-CM

## 2013-02-08 DIAGNOSIS — E119 Type 2 diabetes mellitus without complications: Secondary | ICD-10-CM

## 2013-02-08 DIAGNOSIS — R262 Difficulty in walking, not elsewhere classified: Secondary | ICD-10-CM

## 2013-02-21 ENCOUNTER — Telehealth: Payer: Self-pay | Admitting: Gastroenterology

## 2013-02-21 NOTE — Telephone Encounter (Signed)
i think this is a mistake. No records in EPIC that I can find showing her is our patient or that we tried to contact him or the family.

## 2013-02-21 NOTE — Telephone Encounter (Signed)
FYI

## 2014-05-12 ENCOUNTER — Inpatient Hospital Stay (HOSPITAL_COMMUNITY)
Admission: AD | Admit: 2014-05-12 | Discharge: 2014-05-18 | DRG: 308 | Disposition: A | Discharge status: Hospice | Payer: Medicare Other | Source: Other Acute Inpatient Hospital | Attending: Cardiology | Admitting: Cardiology

## 2014-05-12 ENCOUNTER — Inpatient Hospital Stay (HOSPITAL_COMMUNITY): Payer: Medicare Other

## 2014-05-12 DIAGNOSIS — I493 Ventricular premature depolarization: Secondary | ICD-10-CM | POA: Diagnosis not present

## 2014-05-12 DIAGNOSIS — S72002G Fracture of unspecified part of neck of left femur, subsequent encounter for closed fracture with delayed healing: Secondary | ICD-10-CM

## 2014-05-12 DIAGNOSIS — E119 Type 2 diabetes mellitus without complications: Secondary | ICD-10-CM

## 2014-05-12 DIAGNOSIS — W1839XA Other fall on same level, initial encounter: Secondary | ICD-10-CM | POA: Diagnosis not present

## 2014-05-12 DIAGNOSIS — Z66 Do not resuscitate: Secondary | ICD-10-CM | POA: Diagnosis present

## 2014-05-12 DIAGNOSIS — IMO0002 Reserved for concepts with insufficient information to code with codable children: Secondary | ICD-10-CM | POA: Diagnosis present

## 2014-05-12 DIAGNOSIS — I359 Nonrheumatic aortic valve disorder, unspecified: Secondary | ICD-10-CM | POA: Diagnosis not present

## 2014-05-12 DIAGNOSIS — I119 Hypertensive heart disease without heart failure: Secondary | ICD-10-CM | POA: Diagnosis not present

## 2014-05-12 DIAGNOSIS — I442 Atrioventricular block, complete: Principal | ICD-10-CM

## 2014-05-12 DIAGNOSIS — Z515 Encounter for palliative care: Secondary | ICD-10-CM

## 2014-05-12 DIAGNOSIS — S72002A Fracture of unspecified part of neck of left femur, initial encounter for closed fracture: Secondary | ICD-10-CM

## 2014-05-12 DIAGNOSIS — Z79899 Other long term (current) drug therapy: Secondary | ICD-10-CM | POA: Diagnosis not present

## 2014-05-12 DIAGNOSIS — Y9289 Other specified places as the place of occurrence of the external cause: Secondary | ICD-10-CM | POA: Diagnosis not present

## 2014-05-12 DIAGNOSIS — S72012A Unspecified intracapsular fracture of left femur, initial encounter for closed fracture: Secondary | ICD-10-CM | POA: Diagnosis present

## 2014-05-12 DIAGNOSIS — Z881 Allergy status to other antibiotic agents status: Secondary | ICD-10-CM | POA: Diagnosis not present

## 2014-05-12 DIAGNOSIS — R410 Disorientation, unspecified: Secondary | ICD-10-CM

## 2014-05-12 DIAGNOSIS — T148XXA Other injury of unspecified body region, initial encounter: Secondary | ICD-10-CM

## 2014-05-12 DIAGNOSIS — R55 Syncope and collapse: Secondary | ICD-10-CM

## 2014-05-12 DIAGNOSIS — M1990 Unspecified osteoarthritis, unspecified site: Secondary | ICD-10-CM | POA: Diagnosis present

## 2014-05-12 DIAGNOSIS — I10 Essential (primary) hypertension: Secondary | ICD-10-CM

## 2014-05-12 DIAGNOSIS — E1165 Type 2 diabetes mellitus with hyperglycemia: Secondary | ICD-10-CM | POA: Diagnosis present

## 2014-05-12 DIAGNOSIS — I472 Ventricular tachycardia: Secondary | ICD-10-CM

## 2014-05-12 DIAGNOSIS — R001 Bradycardia, unspecified: Secondary | ICD-10-CM | POA: Diagnosis not present

## 2014-05-12 DIAGNOSIS — F0390 Unspecified dementia without behavioral disturbance: Secondary | ICD-10-CM

## 2014-05-12 DIAGNOSIS — I4729 Other ventricular tachycardia: Secondary | ICD-10-CM

## 2014-05-12 DIAGNOSIS — R52 Pain, unspecified: Secondary | ICD-10-CM

## 2014-05-12 DIAGNOSIS — I441 Atrioventricular block, second degree: Secondary | ICD-10-CM

## 2014-05-12 DIAGNOSIS — D72829 Elevated white blood cell count, unspecified: Secondary | ICD-10-CM

## 2014-05-12 LAB — CBC
HEMATOCRIT: 37.8 % — AB (ref 39.0–52.0)
Hemoglobin: 12.5 g/dL — ABNORMAL LOW (ref 13.0–17.0)
MCH: 30.5 pg (ref 26.0–34.0)
MCHC: 33.1 g/dL (ref 30.0–36.0)
MCV: 92.2 fL (ref 78.0–100.0)
PLATELETS: 173 10*3/uL (ref 150–400)
RBC: 4.1 MIL/uL — AB (ref 4.22–5.81)
RDW: 13.7 % (ref 11.5–15.5)
WBC: 15.2 10*3/uL — AB (ref 4.0–10.5)

## 2014-05-12 LAB — APTT: aPTT: 29 seconds (ref 24–37)

## 2014-05-12 MED ORDER — ALPRAZOLAM 0.5 MG PO TABS
0.5000 mg | ORAL_TABLET | Freq: Two times a day (BID) | ORAL | Status: DC | PRN
  Administered 2014-05-13 – 2014-05-16 (×3): 0.5 mg via ORAL
  Filled 2014-05-12 (×4): qty 1

## 2014-05-12 MED ORDER — BUPROPION HCL ER (SR) 150 MG PO TB12
150.0000 mg | ORAL_TABLET | Freq: Two times a day (BID) | ORAL | Status: DC
  Administered 2014-05-13 – 2014-05-17 (×9): 150 mg via ORAL
  Filled 2014-05-12 (×13): qty 1

## 2014-05-12 MED ORDER — RISAQUAD PO CAPS
1.0000 | ORAL_CAPSULE | Freq: Every day | ORAL | Status: DC
  Administered 2014-05-13 – 2014-05-17 (×5): 1 via ORAL
  Filled 2014-05-12 (×5): qty 1

## 2014-05-12 MED ORDER — LIDOCAINE 5 % EX PTCH
2.0000 | MEDICATED_PATCH | CUTANEOUS | Status: DC
  Administered 2014-05-13 – 2014-05-17 (×5): 2 via TRANSDERMAL
  Filled 2014-05-12 (×6): qty 2

## 2014-05-12 MED ORDER — ONDANSETRON HCL 4 MG/2ML IJ SOLN: 4.0000 mg | Freq: Four times a day (QID) | INTRAMUSCULAR | Status: DC | PRN

## 2014-05-12 MED ORDER — FOLIC ACID 1 MG PO TABS
1.0000 mg | ORAL_TABLET | Freq: Every day | ORAL | Status: DC
  Administered 2014-05-13 – 2014-05-17 (×5): 1 mg via ORAL
  Filled 2014-05-12 (×5): qty 1

## 2014-05-12 MED ORDER — CEFDINIR 125 MG/5ML PO SUSR
250.0000 mg | Freq: Two times a day (BID) | ORAL | Status: DC
  Administered 2014-05-13 – 2014-05-17 (×6): 250 mg via ORAL
  Filled 2014-05-12 (×15): qty 10

## 2014-05-12 MED ORDER — PAROXETINE HCL 20 MG PO TABS
20.0000 mg | ORAL_TABLET | Freq: Every day | ORAL | Status: DC
  Administered 2014-05-13 – 2014-05-17 (×5): 20 mg via ORAL
  Filled 2014-05-12 (×5): qty 1

## 2014-05-12 MED ORDER — AMLODIPINE BESYLATE 10 MG PO TABS
10.0000 mg | ORAL_TABLET | Freq: Every day | ORAL | Status: DC
  Administered 2014-05-13 – 2014-05-15 (×3): 10 mg via ORAL
  Filled 2014-05-12 (×3): qty 1

## 2014-05-12 MED ORDER — MODAFINIL 200 MG PO TABS: 200.0000 mg | ORAL_TABLET | Freq: Every day | ORAL | Status: DC

## 2014-05-12 MED ORDER — VITAMIN B-12 1000 MCG PO TABS
1000.0000 ug | ORAL_TABLET | Freq: Three times a day (TID) | ORAL | Status: DC
  Administered 2014-05-13 – 2014-05-17 (×12): 1000 ug via ORAL
  Filled 2014-05-12 (×17): qty 1

## 2014-05-12 MED ORDER — HEPARIN SODIUM (PORCINE) 5000 UNIT/ML IJ SOLN
5000.0000 [IU] | Freq: Three times a day (TID) | INTRAMUSCULAR | Status: DC
  Filled 2014-05-12 (×3): qty 1

## 2014-05-12 MED ORDER — VITAMIN B-1 100 MG PO TABS
100.0000 mg | ORAL_TABLET | Freq: Every day | ORAL | Status: DC
  Administered 2014-05-13 – 2014-05-17 (×5): 100 mg via ORAL
  Filled 2014-05-12 (×5): qty 1

## 2014-05-12 MED ORDER — ACETAMINOPHEN 325 MG PO TABS
650.0000 mg | ORAL_TABLET | ORAL | Status: DC | PRN
  Administered 2014-05-13 – 2014-05-16 (×4): 650 mg via ORAL
  Filled 2014-05-12 (×4): qty 2

## 2014-05-12 MED ORDER — ALLOPURINOL 300 MG PO TABS
300.0000 mg | ORAL_TABLET | Freq: Every day | ORAL | Status: DC
  Administered 2014-05-13 – 2014-05-18 (×6): 300 mg via ORAL
  Filled 2014-05-12 (×6): qty 1

## 2014-05-12 MED ORDER — GLUCERNA SHAKE PO LIQD
237.0000 mL | Freq: Three times a day (TID) | ORAL | Status: DC
  Administered 2014-05-13 – 2014-05-18 (×12): 237 mL via ORAL

## 2014-05-12 NOTE — H&P (Addendum)
HPI: [name] is an 37M with dementia, HTN, and DM who presented from OSH with a fall and newly diagnosed CHB.  [name] was at his assisted living facility when he fell and hit his head on a water fountain.  He reports feeling dizzy prior to the fall but denies CP, palpitations, SOB or loss of consciousness.  After falling he reported L hip pain, so he was taken to [facility] ED.  In the ED he was found to be in complete heart block with a ventricular escape of 41bpm.  His BP was 139/99 and remained stable.  Head CT was negative for acute process.  L hip xray showed irregularity of the L femoral head/neck junction without clear fracture.  They recommended follow up CT or MRI.  Per his granddaughter, he is at his baseline mental status.  She also reports that he falls approximately once per month.  She is not aware of any history of cardiac disease.  [name] denies fever, chills, SOB, CP, palpitations, abdominal pain, n/V, dysuria or hematuria.  He endorses lightheadedness and L hip pain.  Review of Systems:     Cardiac Review of Systems: {Y] = yes [ ]  = no  Chest Pain [    ]  Resting SOB [   ] Exertional SOB  [  ]  Orthopnea [  ]   Pedal Edema [   ]    Palpitations [  ] Syncope  [  ]   Presyncope [   ]  General Review of Systems: [Y] = yes [  ]=no Constitional: recent weight change [  ]; anorexia [  ]; fatigue [  ]; nausea [  ]; night sweats [  ]; fever [  ]; or chills [  ];                                                                                                                                          Dental: poor dentition[  ];   Eye : blurred vision [  ]; diplopia [   ]; vision changes [  ];  Amaurosis fugax[  ]; Resp: cough [  ];  wheezing[  ];  hemoptysis[  ]; shortness of breath[  ]; paroxysmal nocturnal dyspnea[  ]; dyspnea on exertion[  ]; or orthopnea[  ];  GI:  gallstones[  ], vomiting[  ];  dysphagia[  ]; melena[  ];  hematochezia [  ]; heartburn[  ];    Hx of  Colonoscopy[  ]; GU: kidney stones [  ]; hematuria[  ];   dysuria [  ];  nocturia[  ];  history of     obstruction [  ];                 Skin: rash, swelling[  ];, hair loss[  ];  peripheral edema[  ];  or itching[  ]; Musculosketetal: myalgias[  ];  joint swelling[  ];  joint erythema[  ];  joint pain[  ];  back pain[  ];  Heme/Lymph: bruising[  ];  bleeding[  ];  anemia[  ];  Neuro: TIA[  ];  headaches[  ];  stroke[  ];  vertigo[  ];  seizures[  ];   paresthesias[  ];  difficulty walking[  ];  Psych:depression[  ]; anxiety[  ];  Endocrine: diabetes[  ];  thyroid dysfunction[  ];  Immunizations: Flu [  ]; Pneumococcal[  ];  Other:  Past Medical History  Diagnosis Date  . Hypertension   . Diabetes mellitus without complication   . Arthritis   . Dementia     Medications prior to admission:  Allopurinol 300mg  daily Amlodipine 10mg  daily Bupropion SR 150mg  daily Cefdinir 300mg  bid  X 5 days (start date unclear) Folic Acid 1mg  dialy Glucerna Shake 1 can with meals and at bedtime Lidocaine 5% patch to each shoulder daily x12 hours Metformin 500mg  bid Metoprolol 12.5mg  bid Modafinil 200mg  daily MVI daily Paroxetine 20mg  daily Risaquad  1 cap daily x 7 days Vitamin B-1 100mg  tab daily Vitamin B12 1,000 mcg daily Alprazolam 0.5mg  q12h prn anxiety/agitation Senna 8.6mg  2 tabs bid prn  Allergies  Allergen Reactions  . Augmentin [Amoxicillin-Pot Clavulanate] Other (See Comments)    Reaction Unknown, from Doctor's office  . Zestril [Lisinopril] Other (See Comments)    Reaction Unknown, from Doctor's office    History   Social History  . Marital Status: Single    Spouse Name: N/A    Number of Children: N/A  . Years of Education: N/A   Occupational History  . Not on file.   Social History Main Topics  . Smoking status: Never Smoker   . Smokeless tobacco: Never Used  . Alcohol Use: Yes     Comment: "too much sometimes"  . Drug Use: No  . Sexual Activity: Not  on file   Other Topics Concern  . Not on file   Social History Narrative  . No narrative on file    No family history on file.  PHYSICAL EXAM: Filed Vitals:   05/12/14 2145  BP: 148/64  Pulse: 44  Temp:   Resp: 16   General:  Well appearing. No respiratory difficulty.  Confused and rambles throughout the interview, but able to answer questions relatively appropriately. HEENT: normal Neck: supple. no JVD. Carotids 2+ bilat; no bruits. No lymphadenopathy or thryomegaly appreciated. Cor:  Bradycardic.  Regular rhythm. No rubs, gallops or murmurs. Lungs: clear bilaterally Abdomen: soft, nontender, nondistended. No hepatosplenomegaly. No bruits or masses. Good bowel sounds. Extremities: no cyanosis, clubbing, rash, edema.  2+ DP/PT bilatearlly.  Able to flex and abduct at L hip, but reports pain. Neuro:  cranial nerves grossly intact. moves all 4 extremities w/o difficulty. Affect pleasant.  ECG: 05/12/14 15:55: Sinus arrhythmia at roughly 75bpm with either CHB vs Mobitz II with PVCs.  RBBB.    No results found for this or any previous visit (from the past 24 hour(s)). No results found.   ASSESSMENT: It is unclear what the underlying rhythm is for [name], as the ECG on 10/19 at 15:51 shows sinus arrhythmia and a slight irregularity of the ventricular rhythm.  The first QRS complex is too soon after the p wave to have conducted, and there is no regularity of the PR interval, making it more likely that this is CHB.  However, it is  odd that both the supraventricular rhythm and escape are irregular.  It is possible that there is inconsistent capture of the ventricles.  It does not appear to be Mobitz I, as the PR interval following a dropped beat is not shorter than the preceding PR interval.  It is either CHB vs Mobitz II with PVCs.  Either way, it is high degree AV block and warrants a PPM if it does not resolve after beta blocker wash-out.  Patient does not have any firm indication  for a beta blocker.  PLAN/DISCUSSION: - Hold metoprolol - No temp pacemaker for now as BP is well-controlled - Watchful waiting for the resumption of AV conduction - CT L hip to evaluate for fracture   Addendum: On further review on [name]'s chart, I noted that the troponin T at the OSH was 0.042 with an upper limit of normal of 0.009.  CK MB was wnl and creatinine was 1.05.  This is an odd pattern for an acute infarct.  Inferior ischemia could certainly cause bradyarrhythmias.  It is unclear how aggressive his family would want to be and what his code status is.  This should be discussed with his granddaughter (POA) in the AM.  For now, will repeat cardiac enzymes and start heparin gtt.  CT of the hip shows a non-displaced subcapital fracture of the L femoral head with mild impaction of the fracture fragments.  Dr. Eulah PontMurphy of Orthopedic Surgery will see [name] today.  He is currently resting comfortably and only has pain with movement.  Given his high degree AV block and NSTEMI, he will likely be managed conservatively until he is more medically stable.  Bedrest for now.

## 2014-05-13 ENCOUNTER — Inpatient Hospital Stay (HOSPITAL_COMMUNITY): Payer: Medicare Other

## 2014-05-13 ENCOUNTER — Encounter (HOSPITAL_COMMUNITY): Payer: Self-pay | Admitting: *Deleted

## 2014-05-13 DIAGNOSIS — R52 Pain, unspecified: Secondary | ICD-10-CM

## 2014-05-13 DIAGNOSIS — F0390 Unspecified dementia without behavioral disturbance: Secondary | ICD-10-CM

## 2014-05-13 DIAGNOSIS — I442 Atrioventricular block, complete: Principal | ICD-10-CM

## 2014-05-13 LAB — BASIC METABOLIC PANEL
ANION GAP: 14 (ref 5–15)
ANION GAP: 14 (ref 5–15)
BUN: 18 mg/dL (ref 6–23)
BUN: 17 mg/dL (ref 6–23)
CALCIUM: 9 mg/dL (ref 8.4–10.5)
CHLORIDE: 99 meq/L (ref 96–112)
CO2: 25 meq/L (ref 19–32)
CO2: 24 mEq/L (ref 19–32)
CREATININE: 0.91 mg/dL (ref 0.50–1.35)
Calcium: 9.3 mg/dL (ref 8.4–10.5)
Chloride: 98 mEq/L (ref 96–112)
Creatinine, Ser: 0.9 mg/dL (ref 0.50–1.35)
GFR calc Af Amer: >90 mL/min (ref >90)
GFR calc non Af Amer: 78 mL/min — ABNORMAL LOW (ref >90)
GFR, EST NON AFRICAN AMERICAN: 78 mL/min — AB (ref >90)
GLUCOSE: 188 mg/dL — AB (ref 70–99)
Glucose, Bld: 198 mg/dL — ABNORMAL HIGH (ref 70–99)
POTASSIUM: 4.3 meq/L (ref 3.7–5.3)
POTASSIUM: 4.5 meq/L (ref 3.7–5.3)
SODIUM: 136 meq/L — AB (ref 137–147)
Sodium: 138 mEq/L (ref 137–147)

## 2014-05-13 LAB — GLUCOSE, CAPILLARY
GLUCOSE-CAPILLARY: 211 mg/dL — AB (ref 70–99)
Glucose-Capillary: 211 mg/dL — ABNORMAL HIGH (ref 70–99)

## 2014-05-13 LAB — TROPONIN I: Troponin I: <0.3 ng/mL (ref <0.30)

## 2014-05-13 LAB — URINALYSIS, ROUTINE W REFLEX MICROSCOPIC
Bilirubin Urine: NEGATIVE
GLUCOSE, UA: NEGATIVE mg/dL
Ketones, ur: NEGATIVE mg/dL
LEUKOCYTES UA: NEGATIVE
Nitrite: NEGATIVE
Protein, ur: 100 mg/dL — AB
Specific Gravity, Urine: 1.022 (ref 1.005–1.030)
UROBILINOGEN UA: 0.2 mg/dL (ref 0.0–1.0)
pH: 6 (ref 5.0–8.0)

## 2014-05-13 LAB — CBC
HCT: 37.2 % — ABNORMAL LOW (ref 39.0–52.0)
Hemoglobin: 12.5 g/dL — ABNORMAL LOW (ref 13.0–17.0)
MCH: 31.1 pg (ref 26.0–34.0)
MCHC: 33.6 g/dL (ref 30.0–36.0)
MCV: 92.5 fL (ref 78.0–100.0)
PLATELETS: 164 10*3/uL (ref 150–400)
RBC: 4.02 MIL/uL — AB (ref 4.22–5.81)
RDW: 13.7 % (ref 11.5–15.5)
WBC: 15.3 10*3/uL — AB (ref 4.0–10.5)

## 2014-05-13 LAB — MAGNESIUM: MAGNESIUM: 1.7 mg/dL (ref 1.5–2.5)

## 2014-05-13 LAB — CK TOTAL AND CKMB (NOT AT ARMC)
CK, MB: 4 ng/mL (ref 0.3–4.0)
Relative Index: INVALID (ref 0.0–2.5)
Total CK: 68 U/L (ref 7–232)

## 2014-05-13 LAB — URINE MICROSCOPIC-ADD ON

## 2014-05-13 LAB — PROTIME-INR
INR: 1.22 (ref 0.00–1.49)
PROTHROMBIN TIME: 15.6 s — AB (ref 11.6–15.2)

## 2014-05-13 LAB — MRSA PCR SCREENING: MRSA by PCR: POSITIVE — AB

## 2014-05-13 LAB — TSH: TSH: 0.723 u[IU]/mL (ref 0.350–4.500)

## 2014-05-13 MED ORDER — INSULIN ASPART 100 UNIT/ML ~~LOC~~ SOLN
0.0000 [IU] | Freq: Three times a day (TID) | SUBCUTANEOUS | Status: DC
  Administered 2014-05-13: 3 [IU] via SUBCUTANEOUS
  Administered 2014-05-14: 2 [IU] via SUBCUTANEOUS

## 2014-05-13 MED ORDER — HEPARIN BOLUS VIA INFUSION
3000.0000 [IU] | Freq: Once | INTRAVENOUS | Status: AC
  Administered 2014-05-13: 3000 [IU] via INTRAVENOUS
  Filled 2014-05-13: qty 3000

## 2014-05-13 MED ORDER — MODAFINIL 100 MG PO TABS
200.0000 mg | ORAL_TABLET | Freq: Every day | ORAL | Status: DC
  Administered 2014-05-13 – 2014-05-16 (×4): 200 mg via ORAL
  Filled 2014-05-13 (×4): qty 2

## 2014-05-13 MED ORDER — MUPIROCIN 2 % EX OINT
1.0000 "application " | TOPICAL_OINTMENT | Freq: Two times a day (BID) | CUTANEOUS | Status: DC
  Administered 2014-05-13 – 2014-05-16 (×6): 1 via NASAL
  Filled 2014-05-13 (×4): qty 22

## 2014-05-13 MED ORDER — INFLUENZA VAC SPLIT QUAD 0.5 ML IM SUSY
0.5000 mL | PREFILLED_SYRINGE | INTRAMUSCULAR | Status: AC
  Administered 2014-05-15: 0.5 mL via INTRAMUSCULAR
  Filled 2014-05-13: qty 0.5

## 2014-05-13 MED ORDER — HEPARIN (PORCINE) IN NACL 100-0.45 UNIT/ML-% IJ SOLN
900.0000 [IU]/h | INTRAMUSCULAR | Status: DC
  Administered 2014-05-13: 900 [IU]/h via INTRAVENOUS
  Filled 2014-05-13: qty 250

## 2014-05-13 MED ORDER — CHLORHEXIDINE GLUCONATE CLOTH 2 % EX PADS
6.0000 | MEDICATED_PAD | Freq: Every day | CUTANEOUS | Status: DC
  Administered 2014-05-14 – 2014-05-15 (×2): 6 via TOPICAL

## 2014-05-13 NOTE — Consult Note (Signed)
ORTHOPAEDIC CONSULTATION  REQUESTING PHYSICIAN: Candee Furbish, MD  Chief Complaint: Left fem neck fracture  HPI: Johnny Hayden is a 80 y.o. male who is demented. He c/o Left groin pain that has lasted for 1-3 days although on further questioning he said it started yesterday but he does not remember a fall. Again, he is demented for my interview. 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  Past Medical History  Diagnosis Date  . Hypertension   . Diabetes mellitus without complication   . Arthritis   . Dementia    Past Surgical History  Procedure Laterality Date  . No past surgeries     History   Social History  . Marital Status: Single    Spouse Name: N/A    Number of Children: N/A  . Years of Education: N/A   Social History Main Topics  . Smoking status: Never Smoker   . Smokeless tobacco: Never Used  . Alcohol Use: No     Comment: "too much sometimes"  . Drug Use: No  . Sexual Activity: None   Other Topics Concern  . None   Social History Narrative  . None   History reviewed. No pertinent family history. Allergies  Allergen Reactions  . Augmentin [Amoxicillin-Pot Clavulanate] Other (See Comments)    Reaction Unknown, from [place] office  . Zestril [Lisinopril] Other (See Comments)    Reaction Unknown, from [place] office   Prior to Admission medications   Medication Sig Start Date End Date Taking? Authorizing Provider  allopurinol (ZYLOPRIM) 300 MG tablet Take 300 mg by mouth daily.    Historical Provider, MD  ascorbic acid (VITAMIN C) 500 MG tablet Take 500 mg by mouth daily.    Historical Provider, MD  CINNAMON PO Take 1 capsule by mouth daily.    Historical Provider, MD  citalopram (CELEXA) 10 MG tablet Take 10 mg by mouth daily.    Historical Provider, MD  donepezil (ARICEPT) 5 MG tablet Take 10 mg by mouth at bedtime. 01/04/13   Velvet Bathe, MD  fish oil-omega-3 fatty acids 1000 MG capsule Take 1 g by mouth daily.    Historical  Provider, MD  folic acid (FOLVITE) 1 MG tablet Take 1 tablet (1 mg total) by mouth daily. 01/04/13   Velvet Bathe, MD  hydrochlorothiazide (HYDRODIURIL) 12.5 MG tablet Take 2 tablets (25 mg total) by mouth daily. 01/05/13   Velvet Bathe, MD  Memantine HCl ER (NAMENDA XR TITRATION PACK) 7 & 14 & 21 &28 MG CP24 Take by mouth.    Historical Provider, MD  Multiple Vitamin (MULTIVITAMIN WITH MINERALS) TABS Take 1 tablet by mouth daily. 01/04/13   Velvet Bathe, MD  thiamine (VITAMIN B-1) 100 MG tablet Take 100 mg by mouth daily.    Historical Provider, MD  vitamin B-12 (CYANOCOBALAMIN) 1000 MCG tablet Take 1,000 mcg by mouth daily.    Historical Provider, MD   Ct Hip Left Wo Contrast  05/12/2014   CLINICAL DATA:  Left hip pain after a fall. Outside x-ray reportedly showed irregularity of the left femoral head/ neck junction.  EXAM: CT OF THE LEFT HIP WITHOUT CONTRAST  TECHNIQUE: Multidetector CT imaging of the left hip was performed according to the standard protocol. Multiplanar CT image reconstructions were also generated.  COMPARISON:  CT abdomen and pelvis 05/07/2010  FINDINGS: Nondisplaced subcapital fracture of the left femoral neck with mild valgus deformity of the hip. Mild impaction of fracture fragments. No evidence of dislocation of  the hip joint. Visualized left hemipelvis and left sacrum appear intact. Mild infiltration in the subcutaneous fat over the left hip consistent with contusion. Soft tissues are otherwise unremarkable.  IMPRESSION: Nondisplaced subcapital fracture of the left femoral neck.   Electronically Signed   By: Lucienne Capers M.D.   On: 05/12/2014 23:56    Positive ROS: All other systems have been reviewed and were otherwise negative with the exception of those mentioned in the HPI and as above.  Labs cbc  Recent Labs  05/12/14 2307 05/13/14 0218  WBC 15.2* 15.3*  HGB 12.5* 12.5*  HCT 37.8* 37.2*  PLT 173 164    Labs inflam No results found for this basename: ESR,  CRP,  in the last 72 hours  Labs coag  Recent Labs  05/13/14 0218  INR 1.22     Recent Labs  05/12/14 2307 05/13/14 0218  NA 138 136*  K 4.3 4.5  CL 99 98  CO2 25 24  GLUCOSE 198* 188*  BUN 18 17  CREATININE 0.91 0.90  CALCIUM 9.3 9.0    Physical Exam: Filed Vitals:   05/13/14 0500  BP: 140/73  Pulse: 56  Temp:   Resp: 12   General: Alert, no acute distress Cardiovascular: No pedal edema Respiratory: No cyanosis, no use of accessory musculature GI: No organomegaly, abdomen is soft and non-tender Skin: No lesions in the area of chief complaint other than those listed below in MSK exam.  Neurologic: Sensation intact distally Psychiatric: Patient is demented Lymphatic: No axillary or cervical lymphadenopathy  MUSCULOSKELETAL:  LLE: pain with log roll, Sensation grossly intact, wiggles toes and ankle, 2+DP Other extremities are atraumatic with painless ROM and NVI.  Assessment: Left femoral neck fracture, likely acute, impacted  Plan: Hip pinning when stable from a cardiology standpoint. I will discuss Risk/Benefit profile with family I have tentatively posted for OR on Thur 10/22 Weight Bearing Status: Bedrest for now, likely WBAT post op PT VTE px: SCD's and Chemical per the cards team, surgery is minimally invasive and does not require cessation of chemical prophylaxis if that would cary high risk   Edmonia Lynch, D, MD Cell [phone]   05/13/2014 5:21 AM

## 2014-05-13 NOTE — Progress Notes (Signed)
PROGRESS NOTE  Subjective:   80 yo man with dementia, HTN, DM and recent dx of high degree AV block. Fell yesterday, fractured his left hip ECG showed high degree AV block Was on metoprolol  - how on hold   Objective:    Vital Signs:   Temp:  [97.7 F (36.5 C)-98.7 F (37.1 C)] 97.8 F (36.6 C) (10/20 0700) Pulse Rate:  [34-60] 60 (10/20 0700) Resp:  [12-23] 12 (10/20 0700) BP: (120-171)/(45-112) 129/57 mmHg (10/20 0700) SpO2:  [95 %-100 %] 98 % (10/20 0700) Weight:  [166 lb 10.7 oz (75.6 kg)] 166 lb 10.7 oz (75.6 kg) (10/19 2100)      24-hour weight change: Weight change:   Weight trends: Filed Weights   05/12/14 2100  Weight: 166 lb 10.7 oz (75.6 kg)    Intake/Output:  10/19 0701 - 10/20 0700 In: 18 [I.V.:18] Out: 200 [Urine:200]     Physical Exam: BP 129/57  Pulse 60  Temp(Src) 97.8 F (36.6 C) (Oral)  Resp 12  Ht 5\' 6"  (1.676 m)  Wt 166 lb 10.7 oz (75.6 kg)  BMI 26.91 kg/m2  SpO2 98%  Wt Readings from Last 3 Encounters:  05/12/14 166 lb 10.7 oz (75.6 kg)  01/18/13 138 lb 12.8 oz (62.959 kg)  01/05/13 142 lb 10.2 oz (64.7 kg)    General: Vital signs reviewed and noted. , elderly man,  Very long beart  Head: Normocephalic, atraumatic.  Eyes: conjunctivae/corneas clear.  EOM's intact.   Throat: normal  Neck:  normal   Lungs:    clear   Heart:  RR, brady  Abdomen:  Soft, non-tender, non-distended    Extremities: No edema, tender over left hip   Neurologic: Demented, thinks he is still working , granddaughter stated that he had retired.   Psych: Pleasant     Labs: BMET:  Recent Labs  05/12/14 2307 05/13/14 0218  NA 138 136*  K 4.3 4.5  CL 99 98  CO2 25 24  GLUCOSE 198* 188*  BUN 18 17  CREATININE 0.91 0.90  CALCIUM 9.3 9.0  MG 1.7  --     Liver function tests: No results found for this basename: AST, ALT, ALKPHOS, BILITOT, PROT, ALBUMIN,  in the last 72 hours No results found for this basename: LIPASE, AMYLASE,  in the  last 72 hours  CBC:  Recent Labs  05/12/14 2307 05/13/14 0218  WBC 15.2* 15.3*  HGB 12.5* 12.5*  HCT 37.8* 37.2*  MCV 92.2 92.5  PLT 173 164    Cardiac Enzymes:  Recent Labs  05/13/14 0543  CKTOTAL 68  CKMB 4.0  TROPONINI <0.30    Coagulation Studies:  Recent Labs  05/13/14 0218  LABPROT 15.6*  INR 1.22     Other results:  Tele:  NSR with 2:1 AVB.  Narrow QRS complex  Medications:    Infusions: . heparin 900 Units/hr (05/13/14 0533)    Scheduled Medications: . acidophilus  1 capsule Oral Daily  . allopurinol  300 mg Oral Daily  . amLODipine  10 mg Oral Daily  . buPROPion  150 mg Oral BID  . cefdinir  250 mg Oral BID  . Chlorhexidine Gluconate Cloth  6 each Topical Q0600  . feeding supplement (GLUCERNA SHAKE)  237 mL Oral TID WC  . folic acid  1 mg Oral Daily  . [START ON 05/14/2014] Influenza vac split quadrivalent PF  0.5 mL Intramuscular Tomorrow-1000  . lidocaine  2 patch Transdermal Q24H  .  modafinil  200 mg Oral Daily  . mupirocin ointment  1 application Nasal BID  . PARoxetine  20 mg Oral Daily  . thiamine  100 mg Oral Daily  . vitamin B-12  1,000 mcg Oral TID    Assessment/ Plan:    1. High degree AV block:  Today he has 2:1 AVB.  Had complete heart block yesterday.  Metoprolol has been stopped. Will continue to observe.   He may need a pacer if his AV block does not improve  2. ? Elevated troponin:  Troponin levels are normal here.  Perhaps the troponin at the outside hospital was mildly elevated.  Will DC heparin   3. Essential HTN:   Continue amlodipine for now.  No BB   4. Hip Fracture:  I discussed with Dr. Eulah PontMurphy.  He will need surgery to allow patient to walk without pain.  He had a normal LV function by echo in 2014.  Will repeat echo to better assess his pre-op evaluation.   As long as anesthesia uses agents that do not worsen AV block, I think he should be able to have hip surgery in several days.      Disposition:    Length of Stay: 1  Vesta MixerPhilip J. Armandina Iman, Montez HagemanJr., MD, [facility] 05/13/2014, 11:54 AM Office [phone] Pager [phone]

## 2014-05-13 NOTE — Care Management Note (Addendum)
    Page 1 of 1   05/16/2014     12:35:15 PM CARE MANAGEMENT NOTE 05/16/2014  Patient:  Johnny Hayden,Johnny Hayden   Account Number:  [id number]  Date Initiated:  05/13/2014  Documentation initiated by:  Junius CreamerWELL,DEBBIE  Subjective/Objective Assessment:   adm w bradycardia     Action/Plan:   from [facility]   Anticipated DC Date:     Anticipated DC Plan:    In-house referral  Clinical Social Worker         Choice offered to / List presented to:             Status of service:   Medicare Important Message given?  YES (If response is "NO", the following Medicare IM given date fields will be blank) Date Medicare IM given:  05/16/2014 Medicare IM given by:  GRAVES-BIGELOW,Rodricus Candelaria Date Additional Medicare IM given:   Additional Medicare IM given by:    Discharge Disposition:    Per UR Regulation:  Reviewed for med. necessity/level of care/duration of stay  If discussed at Long Length of Stay Meetings, dates discussed:    Comments:

## 2014-05-13 NOTE — Progress Notes (Signed)
Patient is constantly pulling at oxygen, pulse ox, leads.  He is cooperative when verbally reoriented but forgets where he is within a few minutes.  He is frequently throwing his legs off the bed to "get up".  The PA, Hal from cardiology was notified and a safety sitter was requested.  Also, he was notified of elevated blood glucose levels on labs and patient's history of DM.  CBGs and SSI were ordered.

## 2014-05-13 NOTE — Progress Notes (Signed)
ANTICOAGULATION CONSULT NOTE - Initial Consult  Pharmacy Consult for Heparin  Indication: chest pain/ACS  Patient Measurements: Height: 5\' 6"  (167.6 cm) Weight: 166 lb 10.7 oz (75.6 kg) IBW/kg (Calculated) : 63.8  Vital Signs: Temp: 97.7 F (36.5 C) (10/20 0300) Temp Source: Axillary (10/20 0300) BP: 129/54 mmHg (10/20 0300) Pulse Rate: 35 (10/20 0300)  Labs:  Recent Labs  05/12/14 2307 05/13/14 0218  HGB 12.5* 12.5*  HCT 37.8* 37.2*  PLT 173 164  APTT 29  --   LABPROT  --  15.6*  INR  --  1.22  CREATININE 0.91 0.90    Estimated Creatinine Clearance: 59.1 ml/min (by C-G formula based on Cr of 0.9).   Medical History: Past Medical History  Diagnosis Date  . Hypertension   . Diabetes mellitus without complication   . Arthritis   . Dementia    Assessment: 80 y/o with mildly elevated trop at OSH per MD note. To start heparin per pharmacy while undergoing work-up. Hgb stable, renal function ok for age, other labs as above.   Goal of Therapy:  Heparin level 0.3-0.7 units/ml Monitor platelets by anticoagulation protocol: Yes   Plan:  -Heparin 3000 units BOLUS -Start heparin drip at 900 units/hr -1330 HL -Daily CBC/HL -Monitor for bleeding  Abran DukeLedford, Cniyah Sproull 05/13/2014,4:56 AM

## 2014-05-14 DIAGNOSIS — E119 Type 2 diabetes mellitus without complications: Secondary | ICD-10-CM

## 2014-05-14 DIAGNOSIS — I10 Essential (primary) hypertension: Secondary | ICD-10-CM

## 2014-05-14 DIAGNOSIS — I359 Nonrheumatic aortic valve disorder, unspecified: Secondary | ICD-10-CM

## 2014-05-14 LAB — CBC
HEMATOCRIT: 36.4 % — AB (ref 39.0–52.0)
HEMOGLOBIN: 12.2 g/dL — AB (ref 13.0–17.0)
MCH: 31.6 pg (ref 26.0–34.0)
MCHC: 33.5 g/dL (ref 30.0–36.0)
MCV: 94.3 fL (ref 78.0–100.0)
Platelets: 148 10*3/uL — ABNORMAL LOW (ref 150–400)
RBC: 3.86 MIL/uL — ABNORMAL LOW (ref 4.22–5.81)
RDW: 13.8 % (ref 11.5–15.5)
WBC: 9 10*3/uL (ref 4.0–10.5)

## 2014-05-14 LAB — GLUCOSE, CAPILLARY
GLUCOSE-CAPILLARY: 187 mg/dL — AB (ref 70–99)
GLUCOSE-CAPILLARY: 168 mg/dL — AB (ref 70–99)
GLUCOSE-CAPILLARY: 144 mg/dL — AB (ref 70–99)
Glucose-Capillary: 156 mg/dL — ABNORMAL HIGH (ref 70–99)

## 2014-05-14 LAB — SURGICAL PCR SCREEN
MRSA, PCR: POSITIVE — AB
STAPHYLOCOCCUS AUREUS: POSITIVE — AB

## 2014-05-14 LAB — HEMOGLOBIN A1C
Hgb A1c MFr Bld: 7.4 % — ABNORMAL HIGH (ref <5.7)
MEAN PLASMA GLUCOSE: 166 mg/dL — AB (ref <117)

## 2014-05-14 MED ORDER — HYDRALAZINE HCL 10 MG PO TABS
10.0000 mg | ORAL_TABLET | Freq: Three times a day (TID) | ORAL | Status: DC
  Administered 2014-05-14 – 2014-05-17 (×8): 10 mg via ORAL
  Filled 2014-05-14 (×16): qty 1

## 2014-05-14 MED ORDER — CEFAZOLIN SODIUM-DEXTROSE 2-3 GM-% IV SOLR: 2.0000 g | INTRAVENOUS | Status: DC

## 2014-05-14 MED ORDER — ACETAMINOPHEN 500 MG PO TABS
1000.0000 mg | ORAL_TABLET | Freq: Once | ORAL | Status: AC
  Administered 2014-05-14: 1000 mg via ORAL
  Filled 2014-05-14: qty 2

## 2014-05-14 MED ORDER — INSULIN ASPART 100 UNIT/ML ~~LOC~~ SOLN
0.0000 [IU] | Freq: Three times a day (TID) | SUBCUTANEOUS | Status: DC
  Administered 2014-05-14 (×2): 3 [IU] via SUBCUTANEOUS
  Administered 2014-05-15: 2 [IU] via SUBCUTANEOUS
  Administered 2014-05-15: 5 [IU] via SUBCUTANEOUS
  Administered 2014-05-15 – 2014-05-16 (×2): 2 [IU] via SUBCUTANEOUS
  Administered 2014-05-16: 3 [IU] via SUBCUTANEOUS
  Administered 2014-05-16: 2 [IU] via SUBCUTANEOUS
  Administered 2014-05-17: 8 [IU] via SUBCUTANEOUS
  Administered 2014-05-17: 5 [IU] via SUBCUTANEOUS

## 2014-05-14 MED ORDER — DEXTROSE-NACL 5-0.45 % IV SOLN
100.0000 mL/h | INTRAVENOUS | Status: DC
  Administered 2014-05-14: 100 mL/h via INTRAVENOUS

## 2014-05-14 NOTE — Progress Notes (Signed)
Transferred to 3west room36 by bed, stable report given to RN, belongings with pt, grandaughter made aware about the transfer.

## 2014-05-14 NOTE — Progress Notes (Signed)
  Echocardiogram 2D Echocardiogram has been performed.  Leta JunglingCooper, Crystalynn Mcinerney M 05/14/2014, 3:06 PM

## 2014-05-14 NOTE — Progress Notes (Signed)
PROGRESS NOTE  Subjective:   80 yo man with dementia, HTN, DM and recent dx of high degree AV block[facility] left [facility] ECG showed high degree AV block Was on metoprolol  - how on hold  HR remains slow, but he is stable  Objective:    Vital Signs:   Temp:  [98 F (36.7 C)-99.1 F (37.3 C)] 98 F (36.7 C) (10/21 0754) Pulse Rate:  [40-100] 40 (10/21 0754) Resp:  [14-25] 16 (10/21 0754) BP: (141-186)/(51-124) 177/57 mmHg (10/21 0754) SpO2:  [93 %-100 %] 98 % (10/21 0754)      24-hour weight change: Weight change:   Weight trends: Filed Weights   05/12/14 2100  Weight: 166 lb 10.7 oz (75.6 kg)    Intake/Output:  10/20 0701 - 10/21 0700 In: 771 [P.O.:480; I.V.:51] Out: 125 [Urine:125]     Physical Exam: BP 177/57  Pulse 40  Temp(Src) 98 F (36.7 C) (Oral)  Resp 16  Ht 5\' 6"  (1.676 m)  Wt 166 lb 10.7 oz (75.6 kg)  BMI 26.91 kg/m2  SpO2 98%  Wt Readings from Last 3 Encounters:  05/12/14 166 lb 10.7 oz (75.6 kg)  01/18/13 138 lb 12.8 oz (62.959 kg)  01/05/13 142 lb 10.2 oz (64.7 kg)    General: Vital signs reviewed and noted. , elderly man,  Very long beard  Head: Normocephalic, atraumatic.  Eyes: conjunctivae/corneas clear.  EOM's intact.   Throat: normal  Neck:  normal   Lungs:  clear   Heart:  RR, brady,   Abdomen:  Soft, non-tender, non-distended    Extremities: No edema, tender over left hip   Neurologic: Demented, thinks he is still working , granddaughter stated that he had retired.   Psych: Pleasant     Labs: BMET:  Recent Labs  05/12/14 2307 05/13/14 0218  NA 138 136*  K 4.3 4.5  CL 99 98  CO2 25 24  GLUCOSE 198* 188*  BUN 18 17  CREATININE 0.91 0.90  CALCIUM 9.3 9.0  MG 1.7  --     Liver function tests: No results found for this basename: AST, ALT, ALKPHOS, BILITOT, PROT, ALBUMIN,  in the last 72 hours No results found for this basename: LIPASE, AMYLASE,  in the last 72 hours  CBC:  Recent  Labs  05/13/14 0218 05/14/14 0237  WBC 15.3* 9.0  HGB 12.5* 12.2*  HCT 37.2* 36.4*  MCV 92.5 94.3  PLT 164 148*    Cardiac Enzymes:  Recent Labs  05/13/14 0543  CKTOTAL 68  CKMB 4.0  TROPONINI <0.30    Coagulation Studies:  Recent Labs  05/13/14 0218  LABPROT 15.6*  INR 1.22     Other results:  Tele:  NSR with 2:1 AVB.  Narrow QRS complex  Medications:    Infusions: . dextrose 5 % and 0.45% NaCl      Scheduled Medications: . acetaminophen  1,000 mg Oral Once  . acidophilus  1 capsule Oral Daily  . allopurinol  300 mg Oral Daily  . amLODipine  10 mg Oral Daily  . buPROPion  150 mg Oral BID  . [START ON 05/15/2014]  ceFAZolin (ANCEF) IV  2 g Intravenous On Call to OR  . cefdinir  250 mg Oral BID  . Chlorhexidine Gluconate Cloth  6 each Topical Q0600  . feeding supplement (GLUCERNA SHAKE)  237 mL Oral TID WC  . folic acid  1 mg Oral Daily  . Influenza  vac split quadrivalent PF  0.5 mL Intramuscular Tomorrow-1000  . insulin aspart  0-9 Units Subcutaneous TID WC  . lidocaine  2 patch Transdermal Q24H  . modafinil  200 mg Oral Daily  . mupirocin ointment  1 application Nasal BID  . PARoxetine  20 mg Oral Daily  . thiamine  100 mg Oral Daily  . vitamin B-12  1,000 mcg Oral TID    Assessment/ Plan:    1. High degree AV block:  Today he has 2:1 AVB.  Had complete heart block yesterday.  Metoprolol has been stopped. Will continue to observe.   He may need a pacer if his AV block does not improve His VS are stable  Can transfer to tele.  2. ? Elevated troponin:  Troponin levels are normal here.  Perhaps the troponin at the outside hospital was mildly elevated.  Will DC heparin   3. Essential HTN:   Continue amlodipine for now.  No BB , allergic to Lisinopril , will add hydralazine.   He is not able to tell us what type of allergy he has to ACE-I.   4. Hip Fracture:  I discussed with Dr. Eulah PontMurphy.  He will need surgery to allow patient to walk without pain.   He had a normal LV function by echo in 2014.  Will repeat echo to better assess his pre-op evaluation.   As long as anesthesia uses agents that do not worsen AV block, I think he should be able to have hip surgery in several days.   5. DM:  Diabetic coordinator has suggested change in diet and increasing sliding scale insulin.   Transfer to tele.    Disposition:  Length of Stay: 2  Alvia GrovePhilip J. Nahser, Jr., MD, [facility] 05/14/2014, 11:36 AM Office [phone] Pager [phone]

## 2014-05-14 NOTE — Progress Notes (Signed)
Inpatient Diabetes Program Recommendations  AACE/ADA: New Consensus Statement on Inpatient Glycemic Control  Target Ranges:  Prepandial:   less than 140 mg/dL      Peak postprandial:   less than 180 mg/dL (1-2 hours)      Critically ill patients:  140 - 180 mg/dL  Pager:  [phone] Hours:  8 am-10pm   Reason for Visit: Elevated glucose  Results for Johnny Hayden, Johnny Hayden (MRN [id number]) as of 05/14/2014 09:34  Ref. Range 05/13/2014 17:24 05/13/2014 21:36 05/14/2014 07:57  Glucose-Capillary Latest Range: 70-99 mg/dL 147211 (H) 829211 (H) 562187 (H)    Inpatient Diabetes Program Recommendations Correction (SSI): Increase to Moderate Novolog Correction Oral Agents: Consider adding oral agents at discharge due to A1C 7.4% and/or refer to Primary MD Diet: Add CHO modified medium to diet   Alfredia Clientrissy Oreste Majeed PhD, RN, BC-ADM Diabetes Coordinator  Office:  [phone] Team Pager:  [phone]

## 2014-05-14 NOTE — Progress Notes (Signed)
Pt is agitated and pulling leads off and condom cath removed he refused the xanax 0.5 mg and his cefdinir oral solution MD was notified and no new orders received will continue to monitor. Ilean SkillVeronica Zola Runion LPN

## 2014-05-15 ENCOUNTER — Encounter (HOSPITAL_COMMUNITY): Payer: Self-pay | Admitting: Anesthesiology

## 2014-05-15 ENCOUNTER — Encounter (HOSPITAL_COMMUNITY)
Admission: AD | Disposition: A | Discharge status: Hospice | Payer: Self-pay | Source: Other Acute Inpatient Hospital | Attending: Cardiology

## 2014-05-15 DIAGNOSIS — I119 Hypertensive heart disease without heart failure: Secondary | ICD-10-CM

## 2014-05-15 DIAGNOSIS — I4729 Other ventricular tachycardia: Secondary | ICD-10-CM | POA: Insufficient documentation

## 2014-05-15 DIAGNOSIS — I442 Atrioventricular block, complete: Secondary | ICD-10-CM | POA: Diagnosis not present

## 2014-05-15 DIAGNOSIS — I441 Atrioventricular block, second degree: Secondary | ICD-10-CM

## 2014-05-15 DIAGNOSIS — R7989 Other specified abnormal findings of blood chemistry: Secondary | ICD-10-CM

## 2014-05-15 DIAGNOSIS — I472 Ventricular tachycardia: Secondary | ICD-10-CM

## 2014-05-15 DIAGNOSIS — S72002A Fracture of unspecified part of neck of left femur, initial encounter for closed fracture: Secondary | ICD-10-CM | POA: Diagnosis present

## 2014-05-15 LAB — GLUCOSE, CAPILLARY
Glucose-Capillary: 137 mg/dL — ABNORMAL HIGH (ref 70–99)
Glucose-Capillary: 214 mg/dL — ABNORMAL HIGH (ref 70–99)
Glucose-Capillary: 141 mg/dL — ABNORMAL HIGH (ref 70–99)
Glucose-Capillary: 167 mg/dL — ABNORMAL HIGH (ref 70–99)

## 2014-05-15 LAB — BASIC METABOLIC PANEL
ANION GAP: 14 (ref 5–15)
BUN: 13 mg/dL (ref 6–23)
CALCIUM: 9.4 mg/dL (ref 8.4–10.5)
CO2: 23 mEq/L (ref 19–32)
Chloride: 100 mEq/L (ref 96–112)
Creatinine, Ser: 0.81 mg/dL (ref 0.50–1.35)
GFR, EST NON AFRICAN AMERICAN: 82 mL/min — AB (ref >90)
Glucose, Bld: 205 mg/dL — ABNORMAL HIGH (ref 70–99)
Potassium: 4 mEq/L (ref 3.7–5.3)
SODIUM: 137 meq/L (ref 137–147)

## 2014-05-15 LAB — CBC
HCT: 37.6 % — ABNORMAL LOW (ref 39.0–52.0)
HEMOGLOBIN: 12.7 g/dL — AB (ref 13.0–17.0)
MCH: 31.9 pg (ref 26.0–34.0)
MCHC: 33.8 g/dL (ref 30.0–36.0)
MCV: 94.5 fL (ref 78.0–100.0)
Platelets: 162 10*3/uL (ref 150–400)
RBC: 3.98 MIL/uL — ABNORMAL LOW (ref 4.22–5.81)
RDW: 13.5 % (ref 11.5–15.5)
WBC: 8.5 10*3/uL (ref 4.0–10.5)

## 2014-05-15 LAB — MAGNESIUM: MAGNESIUM: 2 mg/dL (ref 1.5–2.5)

## 2014-05-15 SURGERY — FIXATION, FEMUR, NECK, PERCUTANEOUS, USING SCREW
Anesthesia: General | Laterality: Left

## 2014-05-15 MED ORDER — MAGNESIUM SULFATE 40 MG/ML IJ SOLN
2.0000 g | Freq: Once | INTRAMUSCULAR | Status: AC
  Administered 2014-05-15: 2 g via INTRAVENOUS
  Filled 2014-05-15: qty 50

## 2014-05-15 SURGICAL SUPPLY — 30 items
COVER PERINEAL POST (MISCELLANEOUS) ×3 IMPLANT
COVER SURGICAL LIGHT HANDLE (MISCELLANEOUS) ×3 IMPLANT
DRAPE IMP U-DRAPE 54X76 (DRAPES) ×3 IMPLANT
DRAPE STERI IOBAN 125X83 (DRAPES) ×3 IMPLANT
DRSG EMULSION OIL 3X3 NADH (GAUZE/BANDAGES/DRESSINGS) ×3 IMPLANT
DRSG TEGADERM 4X4.75 (GAUZE/BANDAGES/DRESSINGS) ×3 IMPLANT
DURAPREP 26ML APPLICATOR (WOUND CARE) ×3 IMPLANT
ELECT REM PT RETURN 9FT ADLT (ELECTROSURGICAL) ×3
ELECTRODE REM PT RTRN 9FT ADLT (ELECTROSURGICAL) ×1 IMPLANT
GAUZE SPONGE 4X4 12PLY STRL (GAUZE/BANDAGES/DRESSINGS) ×3 IMPLANT
GLOVE BIO SURGEON STRL SZ7.5 (GLOVE) ×6 IMPLANT
GLOVE BIOGEL PI IND STRL 8 (GLOVE) ×1 IMPLANT
GLOVE BIOGEL PI INDICATOR 8 (GLOVE) ×2
GOWN STRL REUS W/ TWL LRG LVL3 (GOWN DISPOSABLE) ×1 IMPLANT
GOWN STRL REUS W/TWL LRG LVL3 (GOWN DISPOSABLE) ×2
KIT BASIN OR (CUSTOM PROCEDURE TRAY) ×3 IMPLANT
KIT ROOM TURNOVER OR (KITS) ×3 IMPLANT
LINER BOOT UNIVERSAL DISP (MISCELLANEOUS) ×3 IMPLANT
MANIFOLD NEPTUNE II (INSTRUMENTS) ×3 IMPLANT
NS IRRIG 1000ML POUR BTL (IV SOLUTION) ×3 IMPLANT
PACK GENERAL/GYN (CUSTOM PROCEDURE TRAY) ×3 IMPLANT
PAD ARMBOARD 7.5X6 YLW CONV (MISCELLANEOUS) ×6 IMPLANT
STAPLER VISISTAT 35W (STAPLE) ×3 IMPLANT
SUT MON AB 2-0 CT1 36 (SUTURE) ×3 IMPLANT
SUT VIC AB 0 CT1 27 (SUTURE) ×2
SUT VIC AB 0 CT1 27XBRD ANBCTR (SUTURE) ×1 IMPLANT
TOWEL OR 17X24 6PK STRL BLUE (TOWEL DISPOSABLE) ×3 IMPLANT
TOWEL OR 17X26 10 PK STRL BLUE (TOWEL DISPOSABLE) ×3 IMPLANT
TOWEL OR NON WOVEN STRL DISP B (DISPOSABLE) ×3 IMPLANT
WATER STERILE IRR 1000ML POUR (IV SOLUTION) ×3 IMPLANT

## 2014-05-15 NOTE — Consult Note (Signed)
ELECTROPHYSIOLOGY CONSULT NOTE    Patient ID: Johnny Hayden MRN: [id number], DOB/AGE: 78/27/1933 78 y.o.  Admit date: 05/12/2014 Date of Consult: 05/15/2014  Primary Physician: No primary provider on file. Primary Cardiologist: new to [facility]  Reason for Consultation: heart block  HPI:  Johnny Hayden is a 80 y.o. male with a past medical history of dementia, diabetes, arthritis, and hypertension. He lives in an ALF due to dementia.  On the day of admission, he had dizziness and fell and hit his head.  He had left hip pain after falling and was taken to [facility] ED.  He was found to be in complete heart block with a ventricular rate of 41bpm and was transferred to [facility] for further evaluation.  His beta blocker has been held but he has had persistent high grade heart block and pause dependent Torsades earlier this morning.  EP has been asked to evaluate for treatment options.  He is very confused at this time and unable to provide much history.  He has been evaluated by orthopedics and is pending surgery for left hip fracture.  He currently is oriented to himself and place.  He states he has persistent hip pain.  He has had increased confusion while admitted.  He denies chest pain, shortness of breath, dizziness, LE edema, fevers, chills, recent nausea or vomiting.  ROS is otherwise negative.  His granddaughter is his POA.  He has a living will that states he would like to be resuscitated.   Past Medical History  Diagnosis Date  . Hypertension   . Diabetes mellitus without complication   . Arthritis   . Dementia      Surgical History:  Past Surgical History  Procedure Laterality Date  . No past surgeries       Prescriptions prior to admission  Medication Sig Dispense Refill  . allopurinol (ZYLOPRIM) 300 MG tablet Take 300 mg by mouth daily.      Marland Kitchen amLODipine (NORVASC) 10 MG tablet Take 10 mg by mouth daily.      Marland Kitchen ascorbic acid (VITAMIN C) 500 MG tablet  Take 500 mg by mouth daily.      Marland Kitchen buPROPion (WELLBUTRIN SR) 150 MG 12 hr tablet Take 150 mg by mouth 2 (two) times daily.      Marland Kitchen CINNAMON PO Take 1 capsule by mouth daily.      . citalopram (CELEXA) 10 MG tablet Take 10 mg by mouth daily.      Marland Kitchen donepezil (ARICEPT) 5 MG tablet Take 10 mg by mouth at bedtime.      . fish oil-omega-3 fatty acids 1000 MG capsule Take 1 g by mouth daily.      . folic acid (FOLVITE) 1 MG tablet Take 1 tablet (1 mg total) by mouth daily.  30 tablet  0  . hydrochlorothiazide (HYDRODIURIL) 12.5 MG tablet Take 2 tablets (25 mg total) by mouth daily.  30 tablet  0  . lidocaine (LIDODERM) 5 % Place 1 patch onto the skin daily. Remove & Discard patch within 12 hours or as directed by MD      . Memantine HCl ER (NAMENDA XR TITRATION PACK) 7 & 14 & 21 &28 MG CP24 Take by mouth.      . metFORMIN (GLUCOPHAGE) 500 MG tablet Take 500 mg by mouth 2 (two) times daily with a meal.      . metoprolol tartrate (LOPRESSOR) 25 MG tablet Take 12.5 mg by mouth 2 (two) times daily.      Marland Kitchen  modafinil (PROVIGIL) 200 MG tablet Take 200 mg by mouth daily.      . Multiple Vitamin (MULTIVITAMIN WITH MINERALS) TABS Take 1 tablet by mouth daily.  30 tablet  0  . PARoxetine (PAXIL) 20 MG tablet Take 20 mg by mouth daily.      Marland Kitchen. thiamine (VITAMIN B-1) 100 MG tablet Take 100 mg by mouth daily.      . vitamin B-12 (CYANOCOBALAMIN) 1000 MCG tablet Take 1,000 mcg by mouth daily.        Inpatient Medications:  . acidophilus  1 capsule Oral Daily  . allopurinol  300 mg Oral Daily  . amLODipine  10 mg Oral Daily  . buPROPion  150 mg Oral BID  . cefdinir  250 mg Oral BID  . Chlorhexidine Gluconate Cloth  6 each Topical Q0600  . feeding supplement (GLUCERNA SHAKE)  237 mL Oral TID WC  . folic acid  1 mg Oral Daily  . hydrALAZINE  10 mg Oral 3 times per day  . insulin aspart  0-15 Units Subcutaneous TID WC  . lidocaine  2 patch Transdermal Q24H  . modafinil  200 mg Oral Daily  . mupirocin ointment   1 application Nasal BID  . PARoxetine  20 mg Oral Daily  . thiamine  100 mg Oral Daily  . vitamin B-12  1,000 mcg Oral TID    Allergies:  Allergies  Allergen Reactions  . Augmentin [Amoxicillin-Pot Clavulanate] Other (See Comments)    Reaction Unknown, from Doctor's office  . Zestril [Lisinopril] Other (See Comments)    Reaction Unknown, from Doctor's office    History   Social History  . Marital Status: Single    Spouse Name: N/A    Number of Children: N/A  . Years of Education: N/A   Occupational History  . Not on file.   Social History Main Topics  . Smoking status: Never Smoker   . Smokeless tobacco: Never Used  . Alcohol Use: No     Comment: "too much sometimes"  . Drug Use: No  . Sexual Activity: Not on file   Other Topics Concern  . Not on file   Social History Narrative  . No narrative on file     Family History pt unsure of family history  BP 151/100  Pulse 44  Temp(Src) 98.2 F (36.8 C) (Oral)  Resp 20  Ht 5\' 6"  (1.676 m)  Wt 166 lb 10.7 oz (75.6 kg)  BMI 26.91 kg/m2  SpO2 100%  Physical Exam: Filed Vitals:   05/14/14 2016 05/15/14 0541 05/15/14 1328 05/15/14 2034  BP: 161/68 151/100 150/65 126/62  Pulse: 42 44 48 44  Temp: 98.3 F (36.8 C) 98.2 F (36.8 C) 98.8 F (37.1 C) 98.9 F (37.2 C)  TempSrc: Oral Oral  Oral  Resp: 18 20 20 20   Height:      Weight:      SpO2: 94% 100% 97% 93%    GEN- The patient is elderly and disheveled appearing, alert but with significant conative impairment Head- normocephalic, atraumatic Eyes-  Sclera clear, conjunctiva pink Ears- hearing intact Oropharynx- clear Neck- supple, Lungs- Clear to ausculation bilaterally, normal work of breathing Heart- bradycardic regular rhythm, no murmurs, rubs or gallops, PMI not laterally displaced GI- soft, NT, ND, + BS Extremities- no clubbing, cyanosis, or edema  MS- s/p hp racture Skin- no rash or lesion Psych- profound dementia Neuro- strength and sensation  are intact    Labs:   Lab Results  Component Value Date   WBC 8.5 05/15/2014   HGB 12.7* 05/15/2014   HCT 37.6* 05/15/2014   MCV 94.5 05/15/2014   PLT 162 05/15/2014    Recent Labs Lab 05/15/14 1030  NA 137  K 4.0  CL 100  CO2 23  BUN 13  CREATININE 0.81  CALCIUM 9.4  GLUCOSE 205*    Radiology/Studies: Dg Hip Complete Left 05/13/2014   CLINICAL DATA:  80 year old male with left hip pain after fall. History of multiple falls. Known fracture on prior CT.  EXAM: LEFT HIP - COMPLETE 2+ VIEW  COMPARISON:  CT 05/12/2014  FINDINGS: The bony pelvic ring appears intact, without fracture line identified.  Bilateral hips projects normally over the acetabula.  Left-sided subcapital fracture of the femoral neck, identified on prior CT. Impaction at the fracture site without significant displacement.  Unremarkable appearance of the proximal right femur with no fracture line identified. Degenerative changes of the bilateral hips compatible with osteoarthritis.  Multilevel degenerative disc disease of the lumbar spine with facet disease and anterior bridging osteophyte production.  IMPRESSION: Left-sided subcapital femoral neck fracture, identified on prior CT. There is impaction at the fracture site without significant displacement.  Signed,  Yvone NeuJaime S. Loreta AveWagner, DO  [facility]  [facility]   Electronically Signed   By: Gilmer MorJaime  Wagner D.O.   On: 05/13/2014 08:07   Dg Chest Port 1 View 05/13/2014   CLINICAL DATA:  Leukocytosis.  EXAM: PORTABLE CHEST - 1 VIEW  COMPARISON:  01/01/2013.  FINDINGS: Cardiopericardial silhouette within normal limits. Mediastinal contours normal. Trachea midline. No airspace disease or effusion. Low volume chest. Monitoring leads project over the chest.  IMPRESSION: Low volume chest.  No acute cardiopulmonary disease.   Electronically Signed   By: Andreas NewportGeoffrey  Lamke M.D.   On: 05/13/2014 08:00   EKG: 2:1 heart block, ventricular rate  41  TELEMETRY: high grade heart block, pause dependent PMVT  A/P 1. Second degree AV block The patient has 2:1 AV block with prior complete heart block.  He has a RBB/LAHB as well suggesting advanced conduction system disease.  His AV block persists despite metoprolol washout.  He has had pause dependant torsades also documented.  Unfortunately, the patient has advanced dementia and is quite frail.  This complicates his condition.  I had a long discussion with him this evening regarding his options and at this tiime, he declines PPM implantation.  I think that presently it would be hard to proceed given his clear wishes to avoid the procedure.  I think that perhaps the most prudent option would be to consult palliative care.  They can perhaps work with the patient and his daughter to find the most suitable solution.  Given his advanced age, fragility, and dementia, a palliative approach may be reasonable.  He is at very high risk for sudden death presently given his advanced conduction system disease and pause dependant torsades.  I think that further discussions regarding updated code status would also be warranted.  I will place a consult to palliative care and have Dr Ladona Ridgelaylor come by to evaluate the patient tomorrow as he is the EP doctor in the hospital.  2. Preoperative clearance for Ortho He is presently not a surgical candidate given his AV block.  He will need to have a more conservative palliative approach unless PPM is implanted.    3. Hypertensive cardiovascular disease I will stop norvasc at this time as this medicine can worsen AV block I would  not favor a very aggressive approach to BP management in the setting of advanced AV block.

## 2014-05-15 NOTE — Progress Notes (Addendum)
Clinical Social Work Department BRIEF PSYCHOSOCIAL ASSESSMENT 05/15/2014  Patient:  Johnny Hayden,Johnny Hayden     Account Number:  [id number]     Admit date:  05/12/2014  Clinical Social Worker:  Johnny NakayamaAMBELAL,Johnny Hayden, LCSWA  Date/Time:  05/15/2014 03:09 PM  Referred by:  Physician  Date Referred:  05/15/2014 Referred for  ALF Placement   Other Referral:   Interview type:  Family Other interview type:   Spoke with pt granddaughter    PSYCHOSOCIAL DATA Living Status:  FACILITY Admitted from facility:  Other Level of care:  Assisted Living Primary support name:  Johnny Hayden Primary support relationship to patient:  FAMILY Degree of support available:   Pt has good support    CURRENT CONCERNS Current Concerns  Post-Acute Placement   Other Concerns:    SOCIAL WORK ASSESSMENT / PLAN CSW notified pt was admitted from facility. CSW spoke with pt grannddaughter Johnny Hayden over the phone. Johnny Hayden [facility] She informed CSW pt has been at the facility for around a year. Pt was admitted to pt memory care for a short while but pt was doing well enough to transition to assisted living facility. Per Johnny Hayden, plan will be for pt to return at dc. Johnny Hayden may be able to transport pt back at facility depending on day. CSW to notify on morning of dc to confirm. CSW did call facility and they informed CSW there are no immediate concerns with pt returning at dc, but they will need to review FL2 and confirm at dc. Facility contact will be Johnny Hayden (fax [phone]).   Assessment/plan status:  Psychosocial Support/Ongoing Assessment of Needs Other assessment/ plan:   Information/referral to community resources:   None needed    PATIENT'S/FAMILY'S RESPONSE TO PLAN OF CARE: Pt granddaughter agreeable to return to ALF at dc.       Johnny Hayden, LCSWA [phone]

## 2014-05-15 NOTE — Progress Notes (Signed)
Patient: Johnny Hayden / Admit Date: 05/12/2014 / Date of Encounter: 05/15/2014, 8:18 AM   Subjective: Reports L hip pain. Per patient sitter, he was confused overnight and flipping pancakes this morning. He knows he's in a hospital in [place].   Objective: Telemetry: NSR, also evidence of bradycardia with possible atrial tach vs sinus tach with high grade heart block (reviewed with Dr. Graciela Husbands who agrees this is an uncertain rhythm), and polymorphic VT at 1:30am Physical Exam: Blood pressure 151/100, pulse 44, temperature 98.2 F (36.8 C), temperature source Oral, resp. rate 20, height 5\' 6"  (1.676 m), weight 166 lb 10.7 oz (75.6 kg), SpO2 100.00%. General: Well developed, well nourished WM, in no acute distress. Head: Normocephalic, atraumatic, sclera non-icteric, no xanthomas, nares are without discharge. Neck: Annia Friendly obscures neck but appears supple. Lungs: Clear anteriorly bilaterally to auscultation without wheezes, rales, or rhonchi. Breathing is unlabored. Heart: Reg rhythm, bradycardic S1 S2 without murmurs, rubs, or gallops.  Abdomen: Soft, non-tender, non-distended with normoactive bowel sounds. No rebound/guarding. Extremities: No clubbing or cyanosis. No edema. Distal pedal pulses are 2+ and equal bilaterally. Neuro: Alert and oriented X 3. Moves all extremities spontaneously. Psych:  Responds to questions appropriately with a normal affect.   Intake/Output Summary (Last 24 hours) at 05/15/14 0818 Last data filed at 05/14/14 1700  Gross per 24 hour  Intake    500 ml  Output    100 ml  Net    400 ml    Inpatient Medications:  . acidophilus  1 capsule Oral Daily  . allopurinol  300 mg Oral Daily  . amLODipine  10 mg Oral Daily  . buPROPion  150 mg Oral BID  . cefdinir  250 mg Oral BID  . Chlorhexidine Gluconate Cloth  6 each Topical Q0600  . feeding supplement (GLUCERNA SHAKE)  237 mL Oral TID WC  . folic acid  1 mg Oral Daily  . hydrALAZINE  10 mg Oral 3 times  per day  . Influenza vac split quadrivalent PF  0.5 mL Intramuscular Tomorrow-1000  . insulin aspart  0-15 Units Subcutaneous TID WC  . lidocaine  2 patch Transdermal Q24H  . modafinil  200 mg Oral Daily  . mupirocin ointment  1 application Nasal BID  . PARoxetine  20 mg Oral Daily  . thiamine  100 mg Oral Daily  . vitamin B-12  1,000 mcg Oral TID   Infusions:    Labs:  Recent Labs  05/12/14 2307 05/13/14 0218  NA 138 136*  K 4.3 4.5  CL 99 98  CO2 25 24  GLUCOSE 198* 188*  BUN 18 17  CREATININE 0.91 0.90  CALCIUM 9.3 9.0  MG 1.7  --    No results found for this basename: AST, ALT, ALKPHOS, BILITOT, PROT, ALBUMIN,  in the last 72 hours  Recent Labs  05/14/14 0237 05/15/14 0324  WBC 9.0 8.5  HGB 12.2* 12.7*  HCT 36.4* 37.6*  MCV 94.3 94.5  PLT 148* 162    Recent Labs  05/13/14 0543  CKTOTAL 68  CKMB 4.0  TROPONINI <0.30   No components found with this basename: POCBNP,   Recent Labs  05/13/14 1935  HGBA1C 7.4*     Radiology/Studies:  Dg Hip Complete Left  05/13/2014   CLINICAL DATA:  80 year old male with left hip pain after fall. History of multiple falls. Known fracture on prior CT.  EXAM: LEFT HIP - COMPLETE 2+ VIEW  COMPARISON:  CT 05/12/2014  FINDINGS: The bony  pelvic ring appears intact, without fracture line identified.  Bilateral hips projects normally over the acetabula.  Left-sided subcapital fracture of the femoral neck, identified on prior CT. Impaction at the fracture site without significant displacement.  Unremarkable appearance of the proximal right femur with no fracture line identified. Degenerative changes of the bilateral hips compatible with osteoarthritis.  Multilevel degenerative disc disease of the lumbar spine with facet disease and anterior bridging osteophyte production.  IMPRESSION: Left-sided subcapital femoral neck fracture, identified on prior CT. There is impaction at the fracture site without significant displacement.  Signed,   Yvone NeuJaime S. Loreta AveWagner, DO  [facility]  [facility]   Electronically Signed   By: Gilmer MorJaime  Wagner D.O.   On: 05/13/2014 08:07   Ct Hip Left Wo Contrast  05/12/2014   CLINICAL DATA:  Left hip pain after a fall. Outside x-ray reportedly showed irregularity of the left femoral head/ neck junction.  EXAM: CT OF THE LEFT HIP WITHOUT CONTRAST  TECHNIQUE: Multidetector CT imaging of the left hip was performed according to the standard protocol. Multiplanar CT image reconstructions were also generated.  COMPARISON:  CT abdomen and pelvis 05/07/2010  FINDINGS: Nondisplaced subcapital fracture of the left femoral neck with mild valgus deformity of the hip. Mild impaction of fracture fragments. No evidence of dislocation of the hip joint. Visualized left hemipelvis and left sacrum appear intact. Mild infiltration in the subcutaneous fat over the left hip consistent with contusion. Soft tissues are otherwise unremarkable.  IMPRESSION: Nondisplaced subcapital fracture of the left femoral neck.   Electronically Signed   By: Burman NievesWilliam  Stevens M.D.   On: 05/12/2014 23:56   Dg Chest Port 1 View  05/13/2014   CLINICAL DATA:  Leukocytosis.  EXAM: PORTABLE CHEST - 1 VIEW  COMPARISON:  01/01/2013.  FINDINGS: Cardiopericardial silhouette within normal limits. Mediastinal contours normal. Trachea midline. No airspace disease or effusion. Low volume chest. Monitoring leads project over the chest.  IMPRESSION: Low volume chest.  No acute cardiopulmonary disease.   Electronically Signed   By: Andreas NewportGeoffrey  Lamke M.D.   On: 05/13/2014 08:00   2D Echo 05/14/14 - Left ventricle: The cavity size was normal. Wall thickness was increased in a pattern of moderate LVH. The estimated ejection fraction was 60%. Wall motion was normal; there were no regional wall motion abnormalities. - Aortic valve: Sclerosis without stenosis. There was mild regurgitation. - Left atrium: The atrium was mildly dilated. -  Right ventricle: the cavity size was normal. Systolic function was normal. - Pulmonary arteries: PA peak pressure: 45 mm Hg (S).   Assessment and Plan  1. High grade AV block (CHB, then ?2:1 block) 2. Fall with subsequent L femoral neck fracture 3. Mildly elevated troponin at OSH with negative further values 4. Dementia, reportedly at baseline 5. HTN 6. Diabetes mellitus uncontrolled A1C 7.4 7. Polymorphic VT 05/15/14  He remains off metoprolol and HR persists in the 40's. However, he is not currently symptomatic with this. TSH was normal. He continues to have left hip pain. He did have an episode of polymorphic VT that appears to be real around 1:30am. I discussed with his granddaughter and it sounds like he has a living will that the patient filled out when he entered his ALF that asks to please resuscitate. Will check stat BMET, Mg now and empirically rx 2g mag sulfate. Cannot add BB 2/2 #1. I will discuss the plan further with Dr. Antoine PocheHochrein with regard to going ahead with surgery with the  interim development of #7. Will continue amlodipine/hydralazine and follow BP but if hypertension persists, will consider titration of hydralazine. Granddaughter's work # is [phone] and her cell is listed in the chart.  Dr. Antoine PocheHochrein - Dr. Eulah PontMurphy would like an update with our thoughts on going forward with surgery today at 3pm. Please call him with an update at [phone] (or let me know your thoughts).  Signed, Ronie Spiesayna Dunn PA-C  History and all data above reviewed.  Patient examined.  I agree with the findings as above. This event started with dizziness.  Now with some hip pain.  He was only on low dose beta blocker at the time of his presentation.  He is still having bradycardia.  The patient exam reveals COR:RRR  ,  Lungs: Clear  ,  Abd: Positive bowel sounds, no rebound no guarding, Ext No edema  .  All available labs, radiology testing, previous records reviewed. Agree with documented assessment and plan.  Bradycardia:  I will ask EP to see him to consider pacing before he has any elective hip surgery.  I left this message on Dr. Greig RightMurphy's phone. I discussed this with his granddaughter who was in the room.    Fayrene FearingJames Sheddrick Lattanzio  12:22 PM  05/15/2014

## 2014-05-15 NOTE — Progress Notes (Signed)
CSW (Clinical Child psychotherapistocial Worker) called number for pt granddaughter listed in chart and left voicemail.  Nuriya Stuck, LCSWA [phone]

## 2014-05-16 ENCOUNTER — Encounter (HOSPITAL_COMMUNITY)
Admission: AD | Disposition: A | Discharge status: Hospice | Payer: Self-pay | Source: Other Acute Inpatient Hospital | Attending: Cardiology

## 2014-05-16 DIAGNOSIS — Z515 Encounter for palliative care: Secondary | ICD-10-CM

## 2014-05-16 LAB — POCT I-STAT, CHEM 8
BUN: 21 mg/dL (ref 6–23)
CALCIUM ION: 1.22 mmol/L (ref 1.13–1.30)
Chloride: 102 mEq/L (ref 96–112)
Creatinine, Ser: 1 mg/dL (ref 0.50–1.35)
Glucose, Bld: 201 mg/dL — ABNORMAL HIGH (ref 70–99)
HEMATOCRIT: 50 % (ref 39.0–52.0)
Hemoglobin: 17 g/dL (ref 13.0–17.0)
Potassium: 3.4 mEq/L — ABNORMAL LOW (ref 3.7–5.3)
Sodium: 143 mEq/L (ref 137–147)
TCO2: 26 mmol/L (ref 0–100)

## 2014-05-16 LAB — BASIC METABOLIC PANEL
ANION GAP: 22 — AB (ref 5–15)
BUN: 18 mg/dL (ref 6–23)
CALCIUM: 9.8 mg/dL (ref 8.4–10.5)
CO2: 20 mEq/L (ref 19–32)
Chloride: 101 mEq/L (ref 96–112)
Creatinine, Ser: 0.97 mg/dL (ref 0.50–1.35)
GFR calc Af Amer: 88 mL/min — ABNORMAL LOW (ref >90)
GFR, EST NON AFRICAN AMERICAN: 76 mL/min — AB (ref >90)
Glucose, Bld: 194 mg/dL — ABNORMAL HIGH (ref 70–99)
Potassium: 4.1 mEq/L (ref 3.7–5.3)
SODIUM: 143 meq/L (ref 137–147)

## 2014-05-16 LAB — MAGNESIUM: Magnesium: 2.2 mg/dL (ref 1.5–2.5)

## 2014-05-16 LAB — MRSA PCR SCREENING: MRSA BY PCR: POSITIVE — AB

## 2014-05-16 LAB — CBC
HCT: 45.2 % (ref 39.0–52.0)
HEMATOCRIT: 38.4 % — AB (ref 39.0–52.0)
HEMOGLOBIN: 13 g/dL (ref 13.0–17.0)
Hemoglobin: 15.1 g/dL (ref 13.0–17.0)
MCH: 30.9 pg (ref 26.0–34.0)
MCH: 31.1 pg (ref 26.0–34.0)
MCHC: 33.9 g/dL (ref 30.0–36.0)
MCHC: 33.4 g/dL (ref 30.0–36.0)
MCV: 91.2 fL (ref 78.0–100.0)
MCV: 93 fL (ref 78.0–100.0)
Platelets: 182 10*3/uL (ref 150–400)
Platelets: 203 10*3/uL (ref 150–400)
RBC: 4.21 MIL/uL — ABNORMAL LOW (ref 4.22–5.81)
RBC: 4.86 MIL/uL (ref 4.22–5.81)
RDW: 13.6 % (ref 11.5–15.5)
RDW: 13.7 % (ref 11.5–15.5)
WBC: 7.4 10*3/uL (ref 4.0–10.5)
WBC: 11 10*3/uL — AB (ref 4.0–10.5)

## 2014-05-16 LAB — GLUCOSE, CAPILLARY
GLUCOSE-CAPILLARY: 188 mg/dL — AB (ref 70–99)
GLUCOSE-CAPILLARY: 138 mg/dL — AB (ref 70–99)
Glucose-Capillary: 130 mg/dL — ABNORMAL HIGH (ref 70–99)
Glucose-Capillary: 211 mg/dL — ABNORMAL HIGH (ref 70–99)
Glucose-Capillary: 250 mg/dL — ABNORMAL HIGH (ref 70–99)

## 2014-05-16 SURGERY — FIXATION, FEMUR, NECK, PERCUTANEOUS, USING SCREW
Anesthesia: General | Laterality: Left

## 2014-05-16 SURGERY — PERMANENT PACEMAKER INSERTION
Anesthesia: LOCAL

## 2014-05-16 MED ORDER — SODIUM CHLORIDE 0.9 % IV SOLN: INTRAVENOUS | Status: DC

## 2014-05-16 MED ORDER — SODIUM CHLORIDE 0.9 % IR SOLN
80.0000 mg | Status: DC
  Filled 2014-05-16: qty 2

## 2014-05-16 MED ORDER — VANCOMYCIN HCL IN DEXTROSE 1-5 GM/200ML-% IV SOLN
1000.0000 mg | Freq: Once | INTRAVENOUS | Status: DC
  Filled 2014-05-16: qty 200

## 2014-05-16 MED ORDER — DOPAMINE-DEXTROSE 3.2-5 MG/ML-% IV SOLN
0.0000 ug/kg/min | INTRAVENOUS | Status: DC
  Administered 2014-05-16 – 2014-05-18 (×2): 5 ug/kg/min via INTRAVENOUS
  Filled 2014-05-16 (×2): qty 250

## 2014-05-16 MED ORDER — MAGNESIUM SULFATE 40 MG/ML IJ SOLN
2.0000 g | Freq: Once | INTRAMUSCULAR | Status: AC
  Administered 2014-05-16: 2 g via INTRAVENOUS

## 2014-05-16 MED ORDER — DOPAMINE-DEXTROSE 3.2-5 MG/ML-% IV SOLN
INTRAVENOUS | Status: AC
  Filled 2014-05-16: qty 250

## 2014-05-16 MED ORDER — MAGNESIUM SULFATE 40 MG/ML IJ SOLN
INTRAMUSCULAR | Status: AC
  Filled 2014-05-16: qty 50

## 2014-05-16 MED ORDER — SORBITOL 70 % SOLN
30.0000 mL | Freq: Every day | Status: DC | PRN
  Filled 2014-05-16: qty 30

## 2014-05-16 NOTE — Consult Note (Signed)
Patient [place]:HCWCBJ [name]      DOB: 01-06-1932      SEG:[id number]     Consult Note from the Palliative Medicine Team at [facility] Requested by: Dr. Rayann Heman     PCP: No primary provider on file. Reason for Consultation: [place]     Phone Number:None  Assessment of patients Current state: I have met with Johnny Hayden who is acutely confused and unable to participate in conversation and his granddaughter, Johnny Hayden. Johnny Hayden tells me that he has said he wants aggressive care/interventions if he needs it. She asked him if he wanted a pacemaker and he said "no but I want it if I will die without it." We discussed dementia and natural progression of the disease and I urged her to consider options in relation to his quality of life. She tells me that he was doing well in ALF and would be able to have conversations with her but would begin to get confused towards evening and nights. She said he gained ~25 lbs since in ALF over the past ~1 yr with 1 episode of urinary incontinence. She said that he has been more withdrawn and not leaving his room very much or interacting with others as much - which I attribute to his dementia. Johnny Hayden confirms that she wants everything done at this time and full code.   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 Johnny Hayden is his granddaughter and says her father was killed in a car accident and that Mr. Study daughter is in a facility herself and is mentally challenged so Johnny Hayden is his only family. Johnny Hayden visits him twice a month. He appears happy and content per Johnny Hayden at ALF although she is unable to name anything he enjoys out of life currently. I urge Johnny Hayden to consider his quality of life and the gains of the things we are asking him to go through. I gave her Hard Choices booklet.    Goals of Care: 1.  Code Status: FULL   2. Scope of Treatment: Continue all available and  offered medical interventions at this time.    4. Disposition: To be determined on outcomes.    3. Symptom Management:   1. Anxiety/Agitation: Alprazolam 0.5 mg BID prn.  2. Pain: Acetaminophen prn. Lidoderm patch.  3. Bowel Regimen: Sorbitol daily prn.  4. Nausea/Vomiting: Ondansetron prn.   4. Psychosocial: Emotional support provided to patient and family.     Patient Documents Completed or Given: Document Given Completed  Advanced Directives Pkt    MOST    DNR    Gone from My Sight    Hard Choices yes     Brief HPI: 80 yo male admitted with fall and complete heart block. PMH dementia, hypertension, diabetes mellitus, arthritis.    ROS: Denies pain, anxiety, nausea, sleep disturbances.     PMH:  Past Medical History  Diagnosis Date  . Hypertension   . Diabetes mellitus without complication   . Arthritis   . Dementia      PSH: Past Surgical History  Procedure Laterality Date  . No past surgeries     I have reviewed the [place] and [facility] and  If appropriate update it with new information. Allergies  Allergen Reactions  . Augmentin [Amoxicillin-Pot Clavulanate] Other (See Comments)    Reaction Unknown, from [place] office  . Zestril [Lisinopril] Other (  See Comments)    Reaction Unknown, from [place] office   Scheduled Meds: . acidophilus  1 capsule Oral Daily  . allopurinol  300 mg Oral Daily  . buPROPion  150 mg Oral BID  . cefdinir  250 mg Oral BID  . Chlorhexidine Gluconate Cloth  6 each Topical Q0600  . feeding supplement (GLUCERNA SHAKE)  237 mL Oral TID WC  . folic acid  1 mg Oral Daily  . hydrALAZINE  10 mg Oral 3 times per day  . insulin aspart  0-15 Units Subcutaneous TID WC  . lidocaine  2 patch Transdermal Q24H  . modafinil  200 mg Oral Daily  . mupirocin ointment  1 application Nasal BID  . PARoxetine  20 mg Oral Daily  . thiamine  100 mg Oral Daily  . vitamin B-12  1,000 mcg Oral TID   Continuous Infusions:  PRN Meds:.acetaminophen,  ALPRAZolam, ondansetron (ZOFRAN) IV    BP 172/70  Pulse 45  Temp(Src) 97.7 F (36.5 C) (Oral)  Resp 18  Ht 5' 6" (1.676 m)  Wt 75.6 kg (166 lb 10.7 oz)  BMI 26.91 kg/m2  SpO2 96%   PPS: 30%   Intake/Output Summary (Last 24 hours) at 05/16/14 1311 Last data filed at 05/16/14 0600  Gross per 24 hour  Intake    240 ml  Output    125 ml  Net    115 ml   LBM: 10/22                       Physical Exam:  General: NAD, pleasant HEENT: [place]/AT, no JVD, moist mucous membranes Chest: CTA throughout, no labored breathing, symmetric CVS: Bradycardic on monitor  Abdomen: Soft, NT, ND, +BS Ext: MAE, no edema, warm to touch Neuro: Awake, alert, oriented to person only  Labs: CBC    Component Value Date/Time   WBC 7.4 05/16/2014 0440   RBC 4.21* 05/16/2014 0440   HGB 13.0 05/16/2014 0440   HCT 38.4* 05/16/2014 0440   PLT 182 05/16/2014 0440   MCV 91.2 05/16/2014 0440   MCH 30.9 05/16/2014 0440   MCHC 33.9 05/16/2014 0440   RDW 13.6 05/16/2014 0440   LYMPHSABS 0.6* 01/01/2013 1305   MONOABS 0.3 01/01/2013 1305   EOSABS 0.0 01/01/2013 1305   BASOSABS 0.0 01/01/2013 1305    BMET    Component Value Date/Time   NA 137 05/15/2014 1030   K 4.0 05/15/2014 1030   CL 100 05/15/2014 1030   CO2 23 05/15/2014 1030   GLUCOSE 205* 05/15/2014 1030   BUN 13 05/15/2014 1030   CREATININE 0.81 05/15/2014 1030   CALCIUM 9.4 05/15/2014 1030   GFRNONAA 82* 05/15/2014 1030   GFRAA >90 05/15/2014 1030    CMP     Component Value Date/Time   NA 137 05/15/2014 1030   K 4.0 05/15/2014 1030   CL 100 05/15/2014 1030   CO2 23 05/15/2014 1030   GLUCOSE 205* 05/15/2014 1030   BUN 13 05/15/2014 1030   CREATININE 0.81 05/15/2014 1030   CALCIUM 9.4 05/15/2014 1030   PROT 6.6 01/01/2013 1435   ALBUMIN 3.5 01/01/2013 1435   AST 34 01/01/2013 1435   ALT 26 01/01/2013 1435   ALKPHOS 90 01/01/2013 1435   BILITOT 0.8 01/01/2013 1435   GFRNONAA 82* 05/15/2014 1030   GFRAA >90 05/15/2014 1030      Time In Time Out Total Time Spent with Patient Total Overall Time  1220 1320 22mn 663m  Greater than 50%  of this time was spent counseling and coordinating care related to the above assessment and plan.  Johnny Sill, NP Palliative Medicine Team Pager # [phone] (M-F 8a-5p) Team Phone # [phone] (Nights/Weekends)

## 2014-05-16 NOTE — Progress Notes (Signed)
Patient is now refusing to have his PPM inserted. His POA for healthcare has signed his consent but the patient will not allow me to shave his beard and I cannot perform sterile PM implant without beard shaved. At this point, I'd suggest hospice and morphine. I do not think there is a reversible cause of his altered mentation and dementia.  Leonia ReevesGregg Spencer Peterkin,M.D.

## 2014-05-16 NOTE — Progress Notes (Signed)
Chaplain responded to code. No family present. Chaplain returned after approx 20 min and spoke with nurse to page chaplain when family arrives. Pager [phone]  05/16/14 2100  Clinical Encounter Type  Visited With Health care provider  Visit Type Code  Referral From Nurse  Charmian MuffStamey, Sheryle Vice F, Chaplain 05/16/2014 10:28 PM

## 2014-05-16 NOTE — Code Documentation (Signed)
CODE BLUE NOTE  Patient Name: Johnny Hayden   MRN: [id number]   Date of Birth/ Sex: 02/09/1932 , male      Admission Date: 05/12/2014  Attending Provider: Donato SchultzMark Skains, MD  Primary Diagnosis: Fracture of femoral neck, left    Indication: Pt was in his usual state of health until this PM, when he was noted to be V.fib, followed by V.tach. Code blue was subsequently called. At the time of arrival on scene, ACLS protocol was underway.    Technical Description:  - CPR performance duration:  ~5 minutes  - Was defibrillation or cardioversion used? Yes x2  - Was external pacer placed? No  - Was patient intubated pre/post CPR? No    Medications Administered: Y = Yes; Blank = No Amiodarone    Atropine    Calcium    Epinephrine  Y  Lidocaine    Magnesium    Norepinephrine    Phenylephrine    Sodium bicarbonate    Vasopressin      Post CPR evaluation:  - Final Status - Was patient successfully resuscitated ? Yes - What is current rhythm? Sinus Rythm - What is current hemodynamic status? Stable   Miscellaneous Information:  - Labs sent, including: Unknown  - Primary team notified?  Yes  - Family Notified? Yes  - Additional notes/ transfer status: [facility]     Physicians Responding to Code Blue: Gloriann LoanK. Glen , Internal Medicine PGY-3 Caryl AdaJazma Phelps, Family Medicine PGY-1

## 2014-05-16 NOTE — Significant Event (Signed)
Patient had episode of Vfib today and was profoundly bradycardic afterwards.  He was recently admitted with a hip fracture and perioperatively discovered to be in 2nd degree mobitz II.  He was scheduled for a PPM but refused as he did not want his beard to be shaved.  He has advanced dementia and his medical POA (granddaughter Selena BattenKim) expressed interest in every thing being done.  Palliative care was called and pt apparently agreed with a PPM however it was not done today.  Post CODE event pt was transferred to ICU and was stable but in 3rd degree heart block.  Low dose dopamine was started and I called his granddaughter to discuss his condition.  She expressed concerns about a PPM in an elderly man with advanced dementia and felt that this was just prolonging his life but not improving is condition.  She expressed that he would NOT want to continue to undergo procedures in his condition.  We decided to continue peripheral dopamine but not to pursue a temporary pacer wire and that if he were to fail to respond to the pressor to allow him to die in peace.  Will plan to doing cardioversion and chest compression as indicated but no pacemaker and no escalation of care with CVL or temporary pacer wires.  Johnny Bucklesaniel R. Chilton SiGreen, MD Cardiology

## 2014-05-16 NOTE — Progress Notes (Signed)
CSW reviewed Palliative Care meeting note completed this afternoon.  Patient and granddaughter currently request full code and full care for patient. Will benefit from PT evaluation to determine if [facility] vs need for increased level of care to SNF. CSW services will follow and assist with d/c plan as indicated.  Lorri Frederickonna T. Jaci LazierCrowder, KentuckyLCSW  [phone]

## 2014-05-16 NOTE — Progress Notes (Signed)
I spoke with his cardiologist and grandaughter POA.   He needs a Visual merchandiserpace maker prior to surgery for his hip. He continues to have pain with any movement of the hip.  His POA Odie Sera([name] Shiner) would like to go forward with Pacer and hip surger. This would help to decrease his morbidity risk by allowing us to remove his foley and mobilize him out of bed sooner.   Will await notice from cards as to when pacer will be in and when I can go forward with hip pinning.  My Cell: [phone]   Margarita Ranaimothy Ilyaas Musto, MD Orthopedics.

## 2014-05-16 NOTE — H&P (View-Only) (Signed)
Subjective:  Despite NPO order pt sitting up in bed eating his breakfast when I came in.  Objective:  Vital Signs in the last 24 hours: Temp:  [97.7 F (36.5 C)-98.9 F (37.2 C)] 97.7 F (36.5 C) (10/23 0618) Pulse Rate:  [44-48] 45 (10/23 0618) Resp:  [18-20] 18 (10/23 0618) BP: (126-172)/(62-70) 172/70 mmHg (10/23 0618) SpO2:  [93 %-97 %] 96 % (10/23 0618)  Intake/Output from previous day:  Intake/Output Summary (Last 24 hours) at 05/16/14 0848 Last data filed at 05/16/14 0600  Gross per 24 hour  Intake    480 ml  Output    325 ml  Net    155 ml    Physical Exam: General appearance: alert, cooperative, no distress and baseline dementia, unable to give any history Lungs: clear to auscultation bilaterally Heart: regular rate and rhythm   Rate: 60  Rhythm: normal sinus rhythm and PACs, lots of artifact on telemetry  Lab Results:  Recent Labs  05/15/14 0324 05/16/14 0440  WBC 8.5 7.4  HGB 12.7* 13.0  PLT 162 182    Recent Labs  05/15/14 1030  NA 137  K 4.0  CL 100  CO2 23  GLUCOSE 205*  BUN 13  CREATININE 0.81   No results found for this basename: TROPONINI, CK, MB,  in the last 72 hours No results found for this basename: INR,  in the last 72 hours  Imaging: Imaging results have been reviewed  Cardiac Studies:Echo 05/14/14 Study Conclusions  - Left ventricle: The cavity size was normal. Wall thickness was increased in a pattern of moderate LVH. The estimated ejection fraction was 60%. Wall motion was normal; there were no regional wall motion abnormalities. - Aortic valve: Sclerosis without stenosis. There was mild regurgitation. - Left atrium: The atrium was mildly dilated. - Right ventricle: The cavity size was normal. Systolic function was normal. - Pulmonary arteries: PA peak pressure: 45 mm Hg (S).    Assessment/Plan:  80 yo man with dementia, HTN, and DM, admitted with Lt hip fracture 05/12/14 after a fall at the assisted living  facility he resides at. EKG and telemetry has shown intermittent high degree AVB. He was on Lopressor prio to admission. He was seen by Dr Johney FrameAllred yesterday but said he didn't want a pacemaker then. Today he tells me "I hope I don't need one". Palliative Care consult requested for goals of care. Dr Johney FrameAllred did not believe the pt was a surgical candidate because of his arrythmia. I will review with Dr Ladona Ridgelaylor this am.     Principal Problem:   Fracture of femoral neck, left Active Problems:   Complete heart block   Diabetes type 2, uncontrolled   Dementia- lives at ALF   Hypertension    PLAN: Palliative care consult for goals of care.   Corine ShelterLuke Kilroy PA-C Beeper [phone] 05/16/2014, 8:48 AM   EP Attending  Patient seen and examined. I agree with the findings as noted by Mr. Diona FantiKilroy above. Central question is whether patient and family would like to proceed with PPM insertion. He has a clear indication. However, his dementia and now hip fracture make treatment much more complicated. Palliative care is reasonable and transfer to SNF.   [place],M.D.  I spoke to patient's grandaughter who is power of attorney for health care and plan has changed. He will undergo PPM insertion prior to repair of hip fracture. PM will be placed this p.m. The risks/benefits/goals/expectations of PPM have been discussed and he  is willing to proceed.  Leonia ReevesGregg Saidi Santacroce,M.D.

## 2014-05-16 NOTE — Progress Notes (Addendum)
Subjective:  Despite NPO order pt sitting up in bed eating his breakfast when I came in.  Objective:  Vital Signs in the last 24 hours: Temp:  [97.7 F (36.5 C)-98.9 F (37.2 C)] 97.7 F (36.5 C) (10/23 0618) Pulse Rate:  [44-48] 45 (10/23 0618) Resp:  [18-20] 18 (10/23 0618) BP: (126-172)/(62-70) 172/70 mmHg (10/23 0618) SpO2:  [93 %-97 %] 96 % (10/23 0618)  Intake/Output from previous day:  Intake/Output Summary (Last 24 hours) at 05/16/14 0848 Last data filed at 05/16/14 0600  Gross per 24 hour  Intake    480 ml  Output    325 ml  Net    155 ml    Physical Exam: General appearance: alert, cooperative, no distress and baseline dementia, unable to give any history Lungs: clear to auscultation bilaterally Heart: regular rate and rhythm   Rate: 60  Rhythm: normal sinus rhythm and PACs, lots of artifact on telemetry  Lab Results:  Recent Labs  05/15/14 0324 05/16/14 0440  WBC 8.5 7.4  HGB 12.7* 13.0  PLT 162 182    Recent Labs  05/15/14 1030  NA 137  K 4.0  CL 100  CO2 23  GLUCOSE 205*  BUN 13  CREATININE 0.81   No results found for this basename: TROPONINI, CK, MB,  in the last 72 hours No results found for this basename: INR,  in the last 72 hours  Imaging: Imaging results have been reviewed  Cardiac Studies:Echo 05/14/14 Study Conclusions  - Left ventricle: The cavity size was normal. Wall thickness was increased in a pattern of moderate LVH. The estimated ejection fraction was 60%. Wall motion was normal; there were no regional wall motion abnormalities. - Aortic valve: Sclerosis without stenosis. There was mild regurgitation. - Left atrium: The atrium was mildly dilated. - Right ventricle: The cavity size was normal. Systolic function was normal. - Pulmonary arteries: PA peak pressure: 45 mm Hg (S).    Assessment/Plan:  80 yo man with dementia, HTN, and DM, admitted with Lt hip fracture 05/12/14 after a fall at the assisted living  facility he resides at. EKG and telemetry has shown intermittent high degree AVB. He was on Lopressor prio to admission. He was seen by Dr Johney FrameAllred yesterday but said he didn't want a pacemaker then. Today he tells me "I hope I don't need one". Palliative Care consult requested for goals of care. Dr Johney FrameAllred did not believe the pt was a surgical candidate because of his arrythmia. I will review with Dr Ladona Ridgelaylor this am.     Principal Problem:   Fracture of femoral neck, left Active Problems:   Complete heart block   Diabetes type 2, uncontrolled   Dementia- lives at ALF   Hypertension    PLAN: Palliative care consult for goals of care.   Corine ShelterLuke Kilroy PA-C Beeper [phone] 05/16/2014, 8:48 AM   EP Attending  Patient seen and examined. I agree with the findings as noted by Mr. Diona FantiKilroy above. Central question is whether patient and family would like to proceed with PPM insertion. He has a clear indication. However, his dementia and now hip fracture make treatment much more complicated. Palliative care is reasonable and transfer to SNF.   Kelliann Pendergraph,M.D.  I spoke to patient's grandaughter who is power of attorney for health care and plan has changed. He will undergo PPM insertion prior to repair of hip fracture. PM will be placed this p.m. The risks/benefits/goals/expectations of PPM have been discussed and he  is willing to proceed.  Sinahi Knights,M.D. 

## 2014-05-16 NOTE — Interval H&P Note (Signed)
History and Physical Interval Note:  05/16/2014 3:45 PM  Johnny Hayden  has presented today for surgery, with the diagnosis of complete heart block  The various methods of treatment have been discussed with the patient and family. After consideration of risks, benefits and other options for treatment, the patient has consented to  Procedure(s): PERMANENT PACEMAKER INSERTION (N/A) as a surgical intervention .  The patient's history has been reviewed, patient examined, no change in status, stable for surgery.  I have reviewed the patient's chart and labs.  Questions were answered to the patient's satisfaction.     Leonia ReevesGregg Snow Peoples,M.D.

## 2014-05-16 NOTE — Progress Notes (Addendum)
Full note to follow:   I have met with Mr. Kerney who is acutely confused and unable to participate in conversation and his granddaughter, Maudie Mercury. Maudie Mercury tells me that he has said he wants aggressive care/interventions if he needs it. She asked him if he wanted a pacemaker and he said "no but I want it if I will die without it." We discussed dementia and natural progression of the disease and I urged her to consider options in relation to his quality of life. She tells me that he was doing well in ALF and would be able to have conversations with her but would begin to get confused towards evening and nights. She said he gained ~25 lbs since in ALF over the past ~1 yr with 1 episode of urinary incontinence. She said that he has been more withdrawn and not leaving his room very much or interacting with others as much - which I attribute to his dementia. Maudie Mercury confirms that she wants everything done at this time and full code.   Vinie Sill, NP Palliative Medicine Team Pager # [phone] (M-F 8a-5p) Team Phone # [phone] (Nights/Weekends)

## 2014-05-16 NOTE — Progress Notes (Signed)
I have spoken with [name]'s granddaughter, Selena BattenKim, who will meet with me today 10/23 1230 pm regarding goals of care. Thank you for this consult.   [name], NP Palliative Medicine Team Pager # [phone] (M-F 8a-5p) Team Phone # [phone] (Nights/Weekends)

## 2014-05-16 NOTE — Progress Notes (Signed)
In to re-assess patient, diaphoresis noted, patient stated he was hot. Patient alert and awake. While talking to patient he became unresponsive and gasping for air. ACLS started, vtach, vfib noted. See code blue note. Patient transferred to [facility].

## 2014-05-17 DIAGNOSIS — R410 Disorientation, unspecified: Secondary | ICD-10-CM

## 2014-05-17 LAB — GLUCOSE, CAPILLARY
GLUCOSE-CAPILLARY: 286 mg/dL — AB (ref 70–99)
Glucose-Capillary: 280 mg/dL — ABNORMAL HIGH (ref 70–99)
Glucose-Capillary: 234 mg/dL — ABNORMAL HIGH (ref 70–99)

## 2014-05-17 LAB — CBC
HCT: 40.9 % (ref 39.0–52.0)
HEMOGLOBIN: 14.1 g/dL (ref 13.0–17.0)
MCH: 31.3 pg (ref 26.0–34.0)
MCHC: 34.5 g/dL (ref 30.0–36.0)
MCV: 90.9 fL (ref 78.0–100.0)
Platelets: 226 10*3/uL (ref 150–400)
RBC: 4.5 MIL/uL (ref 4.22–5.81)
RDW: 13.4 % (ref 11.5–15.5)
WBC: 10.3 10*3/uL (ref 4.0–10.5)

## 2014-05-17 MED ORDER — ONDANSETRON HCL 4 MG/2ML IJ SOLN: 4.0000 mg | Freq: Three times a day (TID) | INTRAMUSCULAR | Status: DC | PRN

## 2014-05-17 MED ORDER — SODIUM CHLORIDE 0.9 % IV SOLN
INTRAVENOUS | Status: DC
Start: 2014-05-17 — End: 2014-05-18
  Administered 2014-05-17: 05:00:00 via INTRAVENOUS

## 2014-05-17 MED ORDER — ALPRAZOLAM 0.5 MG PO TABS: 1.0000 mg | ORAL_TABLET | ORAL | Status: DC | PRN

## 2014-05-17 MED ORDER — LORAZEPAM 2 MG/ML IJ SOLN: 1.0000 mg | INTRAMUSCULAR | Status: DC | PRN

## 2014-05-17 MED ORDER — SPIRITUS FRUMENTI
1.0000 | ORAL | Status: DC | PRN
  Administered 2014-05-17: 1 via ORAL
  Filled 2014-05-17: qty 1

## 2014-05-17 MED ORDER — MORPHINE SULFATE 2 MG/ML IJ SOLN: 2.0000 mg | INTRAMUSCULAR | Status: DC | PRN

## 2014-05-17 MED FILL — Medication: Qty: 1 | Status: AC

## 2014-05-17 NOTE — Progress Notes (Signed)
Pt pleasantly confused, states he is at home; c/o pain in left hip when moving around in bed; some attempts for pt to get OOB; reorientation provided; pt repositioned in bed; pt drinking red wine per MD PRN orders

## 2014-05-17 NOTE — Progress Notes (Signed)
SUBJECTIVE: The patient has no complaints this morning.  He remains confused.   Torsades last night that self-terminated though he did require defibrillation twice and CPR. Dopamine started with less torsades since.  Further discussions with granddaughter Johnny [facility](HCPOA) by Dr Chilton SiGreen - determination not to pursue pacemaker implantation.     CURRENT MEDICATIONS: . acidophilus  1 capsule Oral Daily  . allopurinol  300 mg Oral Daily  . buPROPion  150 mg Oral BID  . cefdinir  250 mg Oral BID  . Chlorhexidine Gluconate Cloth  6 each Topical Q0600  . feeding supplement (GLUCERNA SHAKE)  237 mL Oral TID WC  . folic acid  1 mg Oral Daily  . hydrALAZINE  10 mg Oral 3 times per day  . insulin aspart  0-15 Units Subcutaneous TID WC  . lidocaine  2 patch Transdermal Q24H  . modafinil  200 mg Oral Daily  . mupirocin ointment  1 application Nasal BID  . PARoxetine  20 mg Oral Daily  . thiamine  100 mg Oral Daily  . vitamin B-12  1,000 mcg Oral TID   . sodium chloride 10 mL/hr at 05/17/14 0454  . DOPamine 10 mcg/kg/min (05/17/14 0400)    OBJECTIVE: Physical Exam: Filed Vitals:   05/17/14 0400 05/17/14 0500 05/17/14 0600 05/17/14 0700  BP: 123/52 136/53 128/63 129/62  Pulse: 45 37 54 53  Temp: 97.7 F (36.5 C)   98.1 F (36.7 C)  TempSrc: Oral   Oral  Resp: 15 16 13 13   Height:      Weight:      SpO2: 100% 99% 98% 99%    Intake/Output Summary (Last 24 hours) at 05/17/14 [id number] Last data filed at 05/17/14 0700  Gross per 24 hour  Intake  239.1 ml  Output    600 ml  Net -360.9 ml    Telemetry reveals sinus rhythm with high grade heart block; no more torsades since last night  GEN- The patient is ill appearing, alert but confused Head- normocephalic, atraumatic Eyes-  Sclera clear, conjunctiva pink Ears- hearing intact Oropharynx- clear Neck- supple,   Lungs- Clear to ausculation bilaterally, normal work of breathing Heart- bradycardic regular rhythm  GI- soft, NT, ND, +  BS Extremities- no clubbing, cyanosis, or edema Skin- no rash or lesion Psych- euthymic mood, full affect Neuro- strength and sensation are intact  LABS: Basic Metabolic Panel:  Recent Labs  28/41/3210/22/15 1030 05/16/14 2100 05/16/14 2117  NA 137 143 143  K 4.0 4.1 3.4*  CL 100 101 102  CO2 23 20  --   GLUCOSE 205* 194* 201*  BUN 13 18 21   CREATININE 0.81 0.97 1.00  CALCIUM 9.4 9.8  --   MG 2.0 2.2  --    CBC:  Recent Labs  05/16/14 2100 05/16/14 2117 05/17/14 0210  WBC 11.0*  --  10.3  HGB 15.1 17.0 14.1  HCT 45.2 50.0 40.9  MCV 93.0  --  90.9  PLT 203  --  226    RADIOLOGY: Dg Hip Complete Left 05/13/2014   CLINICAL DATA:  80 year old male with left hip pain after fall. History of multiple falls. Known fracture on prior CT.  EXAM: LEFT HIP - COMPLETE 2+ VIEW  COMPARISON:  CT 05/12/2014  FINDINGS: The bony pelvic ring appears intact, without fracture line identified.  Bilateral hips projects normally over the acetabula.  Left-sided subcapital fracture of the femoral neck, identified on prior CT. Impaction at the fracture site without significant displacement.  Unremarkable appearance of the proximal right femur with no fracture line identified. Degenerative changes of the bilateral hips compatible with osteoarthritis.  Multilevel degenerative disc disease of the lumbar spine with facet disease and anterior bridging osteophyte production.  IMPRESSION: Left-sided subcapital femoral neck fracture, identified on prior CT. There is impaction at the fracture site without significant displacement.  Signed,  Yvone NeuJaime S. Loreta AveWagner, DO  [facility]  [facility]   Electronically Signed   By: Gilmer MorJaime  Wagner D.O.   On: 05/13/2014 08:07   Dg Chest Port 1 View 05/13/2014   CLINICAL DATA:  Leukocytosis.  EXAM: PORTABLE CHEST - 1 VIEW  COMPARISON:  01/01/2013.  FINDINGS: Cardiopericardial silhouette within normal limits. Mediastinal contours normal. Trachea  midline. No airspace disease or effusion. Low volume chest. Monitoring leads project over the chest.  IMPRESSION: Low volume chest.  No acute cardiopulmonary disease.   Electronically Signed   By: Andreas NewportGeoffrey  Lamke M.D.   On: 05/13/2014 08:00    ASSESSMENT AND PLAN:  Principal Problem:   Fracture of femoral neck, left Active Problems:   Diabetes type 2, uncontrolled   Hypertension   Dementia- lives at ALF   Complete heart block   Palliative care encounter  1.  Complete heart block/pause dependent Torsades Patient refused PPM implant 05-16-14.  He is clear that this is not his wishes again today. After further discussion with granddaughter [facility](HCPOA), will not pursue PPM implantation.  I had a long discussion with her this am by phone and at this time, she agrees that the most appropriate option is DNR/DNR with no resuscitation attempts, defibrillation, or escalation of care.  She would like to have palliative care come back for further discussions about possible hospice measures/ palliative measures.  Palliative consult noted.  As status has changed, we will ask them to return.  2.  Left hip fracture Not currently a candidate for surgery with heart block  3.  Hypertensive cardiovascular disease Stable No change required today  Prognosis is very poor.  Family is aware.  I agree with granddaughter (POA) that comfort measures are appropriate.

## 2014-05-17 NOTE — Progress Notes (Addendum)
Palliative Medicine Team Progress note   Events last PM noted. I met with patient and his grand-daughter Maudie Mercury and her husband to discuss goals of care and next steps. Mr. Dziedzic does not have capacity to make his own decisions- he is tangential and confabulates making direct communication difficult. He however was extremely alert on my visit-he has no recollection of events over last few hours or last PM. He is sore in his chest and tells me that he feels like he is in prison. He is not aware that he has a "bad heart" on direct questioning, but when I asked him what doctors and providers were telling him he said, "that I am going to die". He answered my questions with stories about riding motorcycles, making homemade wine, being in jail for his antics in youth and how he had cheated death on several occasions. Meanwhile his monitor continues to show what appears to be a wide complex irregular rhythm -he is asymptomatic.  He is too unstable to have his Hip Pinned for his acute hip fracture-will need pain control, and will be bed-bound.  I reviewed his medical situation, acknowledged the serious nature of his heart problems and that really at anytime he could have sudden cardiac death. I felt it was appropriate to address any last wishes given that he was so alert and expected death being immanent.  1. DNR 2. "Spiritus frementi" by request -red wine ordered from pharmacy -he told me he only liked a certain type but then said wait just bring me any bottle at this point. 3. Patient has a love of dogs- if he survives will ask if Autumn Patty can bring over a therapy dog  4. Chaplain consult 5. I have left his dopamine infusion for now- will discontinue in AM after his wine and comfort measures are in place I am concerned that he will have a very rapid death after dobutamine is stopped with a rate in the 40s - would like to give grand-daughter a chance to be at bedside.  Full comfort- if he survives would recommend  a hospice facility.  Time: 4PM-5:15PM 75 minutes. Greater than 50%  of this time was spent counseling and coordinating care related to the above assessment and plan.   Lane Hacker, DO Palliative Medicine [phone]

## 2014-05-18 DIAGNOSIS — R55 Syncope and collapse: Secondary | ICD-10-CM

## 2014-05-18 MED ORDER — HYDRALAZINE HCL 10 MG PO TABS: 10.0000 mg | ORAL_TABLET | Freq: Three times a day (TID) | ORAL | Status: AC

## 2014-05-18 MED ORDER — MORPHINE SULFATE 2 MG/ML IJ SOLN: 2.0000 mg | INTRAMUSCULAR | Status: AC | PRN

## 2014-05-18 MED ORDER — SORBITOL 70 % SOLN: 30.0000 mL | Freq: Every day | Status: AC | PRN

## 2014-05-18 MED ORDER — LIDOCAINE 5 % EX PTCH: 2.0000 | MEDICATED_PATCH | CUTANEOUS | Status: AC

## 2014-05-18 MED ORDER — ONDANSETRON HCL 4 MG/2ML IJ SOLN: 4.0000 mg | Freq: Three times a day (TID) | INTRAMUSCULAR | Status: AC | PRN

## 2014-05-18 MED ORDER — SPIRITUS FRUMENTI: 1.0000 | ORAL | Status: AC | PRN

## 2014-05-18 MED ORDER — LORAZEPAM 2 MG/ML IJ SOLN: 1.0000 mg | INTRAMUSCULAR | Status: AC | PRN

## 2014-05-18 NOTE — Discharge Summary (Addendum)
Physician Discharge Summary      Patient ID: Johnny Hayden MRN: [id number] DOB/AGE: 78/11/1931 78 y.o.  Admit date: 05/12/2014 Discharge date: 05/18/2014  Primary Discharge Diagnosis 1. Complete heart block Secondary Discharge Diagnosis1. Syncope, 2. Left femoral neck fracture, 3. Bradycardia dependant torsades, 4. dementia  Significant Diagnostic Studies: CT scan of the hip  Consults: orthopedic surgery, palliative care, electrophysiology  Hospital Course:  [facility].  He was found to have complete heart block as well as a left hip fracture.  He was evaluated by Orthopedics who recommended hip pinning.  Unfortunately, he was felt to not be an operative candidate due to heart block.  Beta blockers were held and his AV block improved to second degree type II AV block.  He was observed to have bradycardia dependant torsades for which he required CPR and defibrillation.  He was transferred to the ICD and remained stable on low dose dopamine.  Electrophysiology was consulted and pacemaker implantation was advised.  The patient declined PPM implantation.  Due to his advanced dementia, discussions were had with his granddaughter (MPOA) and palliative care was consulted.  Ultimately, it was decides that a palliative approach was in the patients best interest.  Arrangements per patient's family request for transfer to [facility] were made.  The patient was kept comfortable.     Discharge Exam: Filed Vitals:   05/18/14 1400  BP:   Pulse: 48  Temp:   Resp: 20    Telemetry reveals sinus rhythm with 2:1 AV block  GEN- The patient is ill appearing, alert but confused  Head- normocephalic, atraumatic  Eyes- Sclera clear, conjunctiva pink  Ears- hearing intact  Oropharynx- clear  Neck- supple,  Lungs- Clear to ausculation bilaterally, normal work of breathing  Heart- bradycardic regular rhythm  GI- soft, NT, ND, + BS  Extremities-  no clubbing, cyanosis, or edema  Skin- no rash or lesion  Psych- euthymic mood, full affect  Neuro- strength and sensation are intact     Labs:   Lab Results  Component Value Date   WBC 10.3 05/17/2014   HGB 14.1 05/17/2014   HCT 40.9 05/17/2014   MCV 90.9 05/17/2014   PLT 226 05/17/2014     Recent Labs Lab 05/16/14 2100 05/16/14 2117  NA 143 143  K 4.1 3.4*  CL 101 102  CO2 20  --   BUN 18 21  CREATININE 0.97 1.00  CALCIUM 9.8  --   GLUCOSE 194* 201*   Lab Results  Component Value Date   CKTOTAL 68 05/13/2014   CKMB 4.0 05/13/2014   TROPONINI <0.30 05/13/2014    FOLLOW UP PLANS AND APPOINTMENTS    Medication List    STOP taking these medications       ascorbic acid 500 MG tablet  Commonly known as:  VITAMIN C     buPROPion 150 MG 12 hr tablet  Commonly known as:  WELLBUTRIN SR     CINNAMON PO     citalopram 10 MG tablet  Commonly known as:  CELEXA     donepezil 5 MG tablet  Commonly known as:  ARICEPT     fish oil-omega-3 fatty acids 1000 MG capsule     folic acid 1 MG tablet  Commonly known as:  FOLVITE     hydrochlorothiazide 12.5 MG tablet  Commonly known as:  HYDRODIURIL     metFORMIN 500 MG tablet  Commonly known as:  GLUCOPHAGE  metoprolol tartrate 25 MG tablet  Commonly known as:  LOPRESSOR     modafinil 200 MG tablet  Commonly known as:  PROVIGIL     multivitamin with minerals Tabs tablet     NAMENDA XR TITRATION PACK 7 & 14 & 21 &28 MG Cp24  Generic drug:  Memantine HCl ER     PARoxetine 20 MG tablet  Commonly known as:  PAXIL     thiamine 100 MG tablet  Commonly known as:  VITAMIN B-1     vitamin B-12 1000 MCG tablet  Commonly known as:  CYANOCOBALAMIN      TAKE these medications       allopurinol 300 MG tablet  Commonly known as:  ZYLOPRIM  Take 300 mg by mouth daily.     amLODipine 10 MG tablet  Commonly known as:  NORVASC  Take 10 mg by mouth daily.     hydrALAZINE 10 MG tablet  Commonly known as:   APRESOLINE  Take 1 tablet (10 mg total) by mouth every 8 (eight) hours.     lidocaine 5 %  Commonly known as:  LIDODERM  Place 2 patches onto the skin daily. Remove & Discard patch within 12 hours or as directed by MD     LORazepam 2 MG/ML injection  Commonly known as:  ATIVAN  Inject 0.5-1 mLs (1-2 mg total) into the vein every 4 (four) hours as needed for anxiety, seizure or sedation (distress, sleep).     morphine 2 MG/ML injection  Inject 1-2 mLs (2-4 mg total) into the vein every 15 (fifteen) minutes as needed (Acute distress, Dyspnea, Pain, Distress, Arrest).     ondansetron 4 MG/2ML Soln injection  Commonly known as:  ZOFRAN  Inject 2 mLs (4 mg total) into the vein every 8 (eight) hours as needed for nausea or vomiting.     sorbitol 70 % Soln  Take 30 mLs by mouth daily as needed for moderate constipation.     spiritus frumenti Soln  Commonly known as:  ethyl alcohol  Take 1 each by mouth as needed (patient request for comfort).         BRING ALL MEDICATIONS WITH YOU TO FOLLOW UP APPOINTMENTS  Time spent with patient to include physician time:40 minutes Signed: [name], Johnny Bernasconi, MD 05/18/2014, 3:12 PM

## 2014-05-18 NOTE — Progress Notes (Addendum)
EMS picked up pt at this time, Dopamine d/c, IV saline locked. Belongings sent with granddaughter home. Called RN at hospice.

## 2014-05-18 NOTE — Progress Notes (Signed)
Palliative Medicine Team Progress Note  Patient has HR in 40's. Grand-daughter here. She expresses a desire for him to not have a hospital death-she is asking about hospice home availability. I spoke with [facility] RN who will review-hopeful for transfer today. Maintain dopamine at 5 and will stop infusion at time of transport. Grand-daughter's birthday is tomorrow and she is hopeful they can celebrate together.  Anderson MaltaElizabeth Golding, DO Palliative Medicine [phone]

## 2014-05-18 NOTE — Progress Notes (Signed)
    SUBJECTIVE: The patient has no complaints this morning.  He remains confused.   Comfort measures as per palliative care team.   CURRENT MEDICATIONS: . allopurinol  300 mg Oral Daily  . feeding supplement (GLUCERNA SHAKE)  237 mL Oral TID WC  . hydrALAZINE  10 mg Oral 3 times per day  . lidocaine  2 patch Transdermal Q24H   . sodium chloride 10 mL/hr at 05/17/14 0800  . DOPamine 10 mcg/kg/min (05/18/14 0800)    OBJECTIVE: Physical Exam: Filed Vitals:   05/18/14 0400 05/18/14 0500 05/18/14 0600 05/18/14 0731  BP: 153/57 141/60 140/56 155/53  Pulse: 69 49 52 50  Temp:    98.7 F (37.1 C)  TempSrc:    Oral  Resp: 16 23 14 16   Height:      Weight:      SpO2: 92% 85% 94% 94%    Intake/Output Summary (Last 24 hours) at 05/18/14 0912 Last data filed at 05/18/14 0800  Gross per 24 hour  Intake 1016.4 ml  Output   1175 ml  Net -158.6 ml    Telemetry reveals sinus rhythm with 2:1 AV block  GEN- The patient is ill appearing, alert but confused Head- normocephalic, atraumatic Eyes-  Sclera clear, conjunctiva pink Ears- hearing intact Oropharynx- clear Neck- supple,   Lungs- Clear to ausculation bilaterally, normal work of breathing Heart- bradycardic regular rhythm  GI- soft, NT, ND, + BS Extremities- no clubbing, cyanosis, or edema Skin- no rash or lesion Psych- euthymic mood, full affect Neuro- strength and sensation are intact  LABS: Basic Metabolic Panel:  Recent Labs  16/04/9609/22/15 1030 05/16/14 2100 05/16/14 2117  NA 137 143 143  K 4.0 4.1 3.4*  CL 100 101 102  CO2 23 20  --   GLUCOSE 205* 194* 201*  BUN 13 18 21   CREATININE 0.81 0.97 1.00  CALCIUM 9.4 9.8  --   MG 2.0 2.2  --    CBC:  Recent Labs  05/16/14 2100 05/16/14 2117 05/17/14 0210  WBC 11.0*  --  10.3  HGB 15.1 17.0 14.1  HCT 45.2 50.0 40.9  MCV 93.0  --  90.9  PLT 203  --  226    ASSESSMENT AND PLAN:  Principal Problem:   Fracture of femoral neck, left Active Problems:  Diabetes type 2, uncontrolled   Hypertension   Dementia- lives at ALF   Complete heart block   Palliative care encounter  1.  Complete heart block/pause dependent Torsades Patient has refused PPM implant and comfort measures are in place. Would discontinue telemetry at this time. Palliative care team to stop dopamine this am  2.  Left hip fracture Not currently a candidate for surgery with heart block.  Comfort measurances  3.  Hypertensive cardiovascular disease Stable  Prognosis is very poor.  Family is aware.  I anticipate transfer to a nontelemetry/ palliative care floor later today.

## 2014-05-18 NOTE — Progress Notes (Signed)
Report given by phone to Ander SladeJoy, RN at [facility]

## 2014-05-18 NOTE — Progress Notes (Signed)
Patient ID: Johnny Hayden, male   DOB: 03/22/1932, 80 y.o.   MRN: [id number]  Review of events over the last 24hrs appears pacer implant was refused not a candidate for surgery and palliative measures in place DNR.  Hip pinning cancelled for tomorrow update passed along to Dr. Renaye Rakersim Murphy.

## 2014-05-19 SURGERY — FIXATION, FEMUR, NECK, PERCUTANEOUS, USING SCREW
Anesthesia: General | Laterality: Left

## 2014-06-24 DEATH — deceased

## 2014-08-07 ENCOUNTER — Encounter (HOSPITAL_COMMUNITY): Payer: Self-pay | Admitting: Orthopedic Surgery

## 2015-01-16 IMAGING — CR DG HIP (WITH OR WITHOUT PELVIS) 2-3V*L*
4 series · 4 of 4 positions shown · non-contrast
Comparison: CT 05/12/2014

CLINICAL DATA: 82-year-old male with left hip pain after fall.
History of multiple falls. Known fracture on prior CT.

EXAM:
LEFT HIP - COMPLETE 2+ VIEW

[lat (1 of 3)]
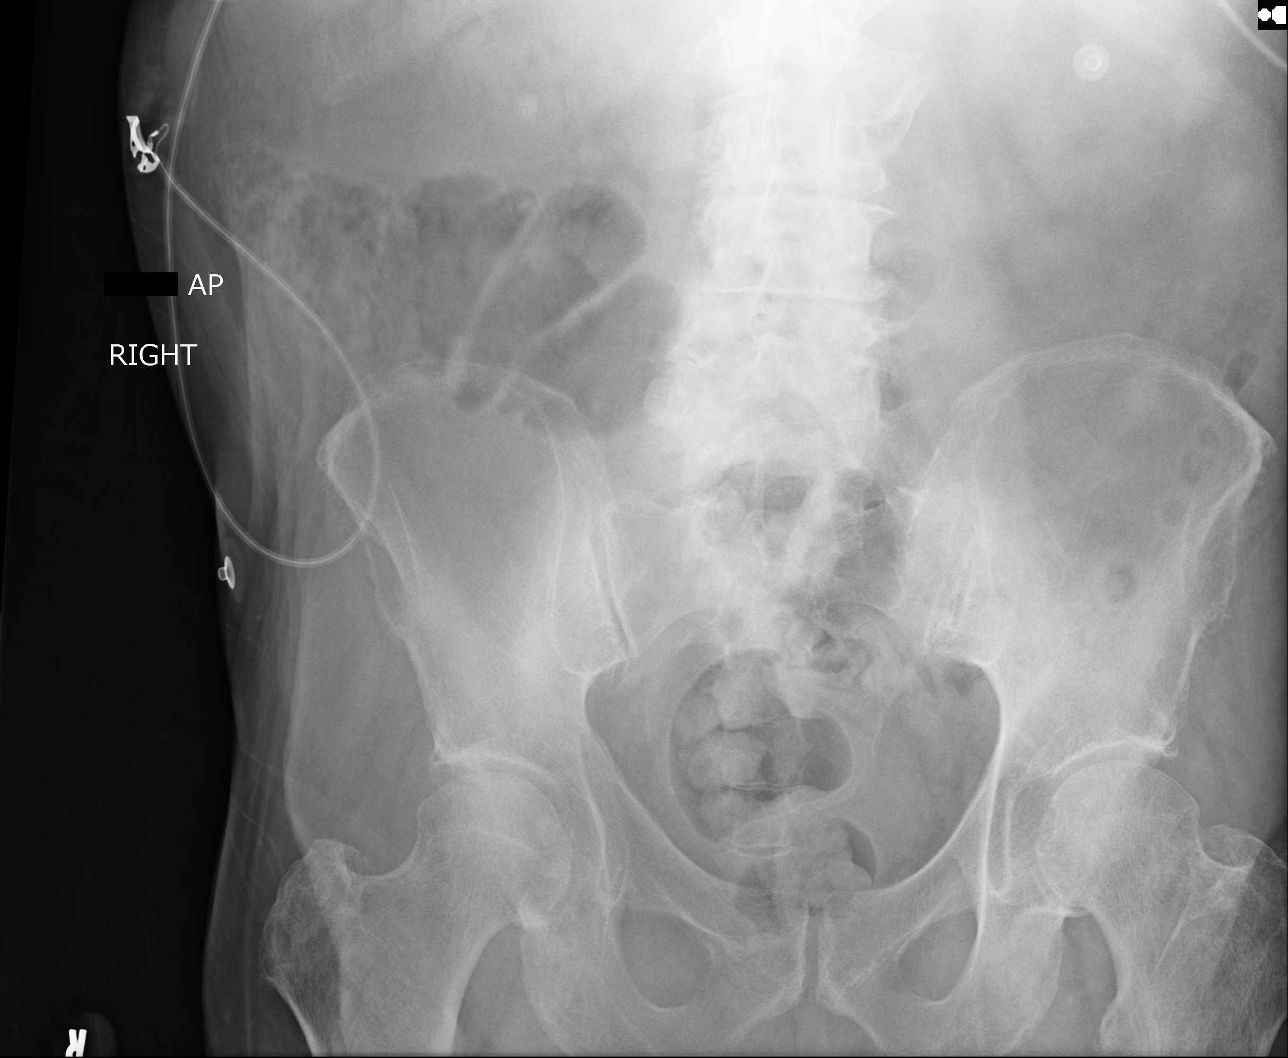

[ap]
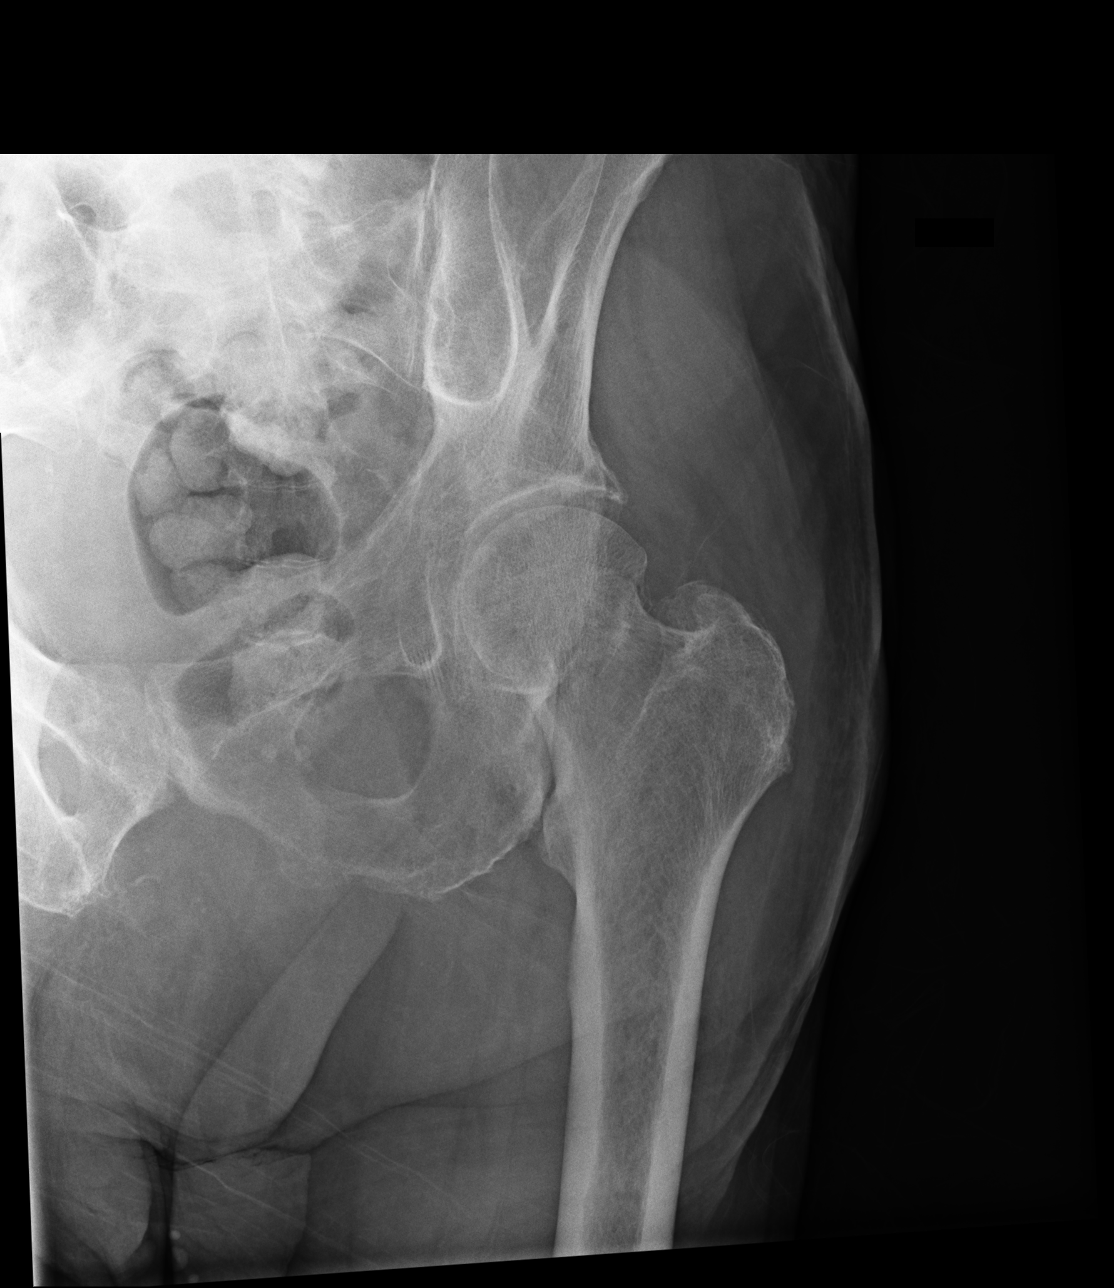

[lat (2 of 3)]
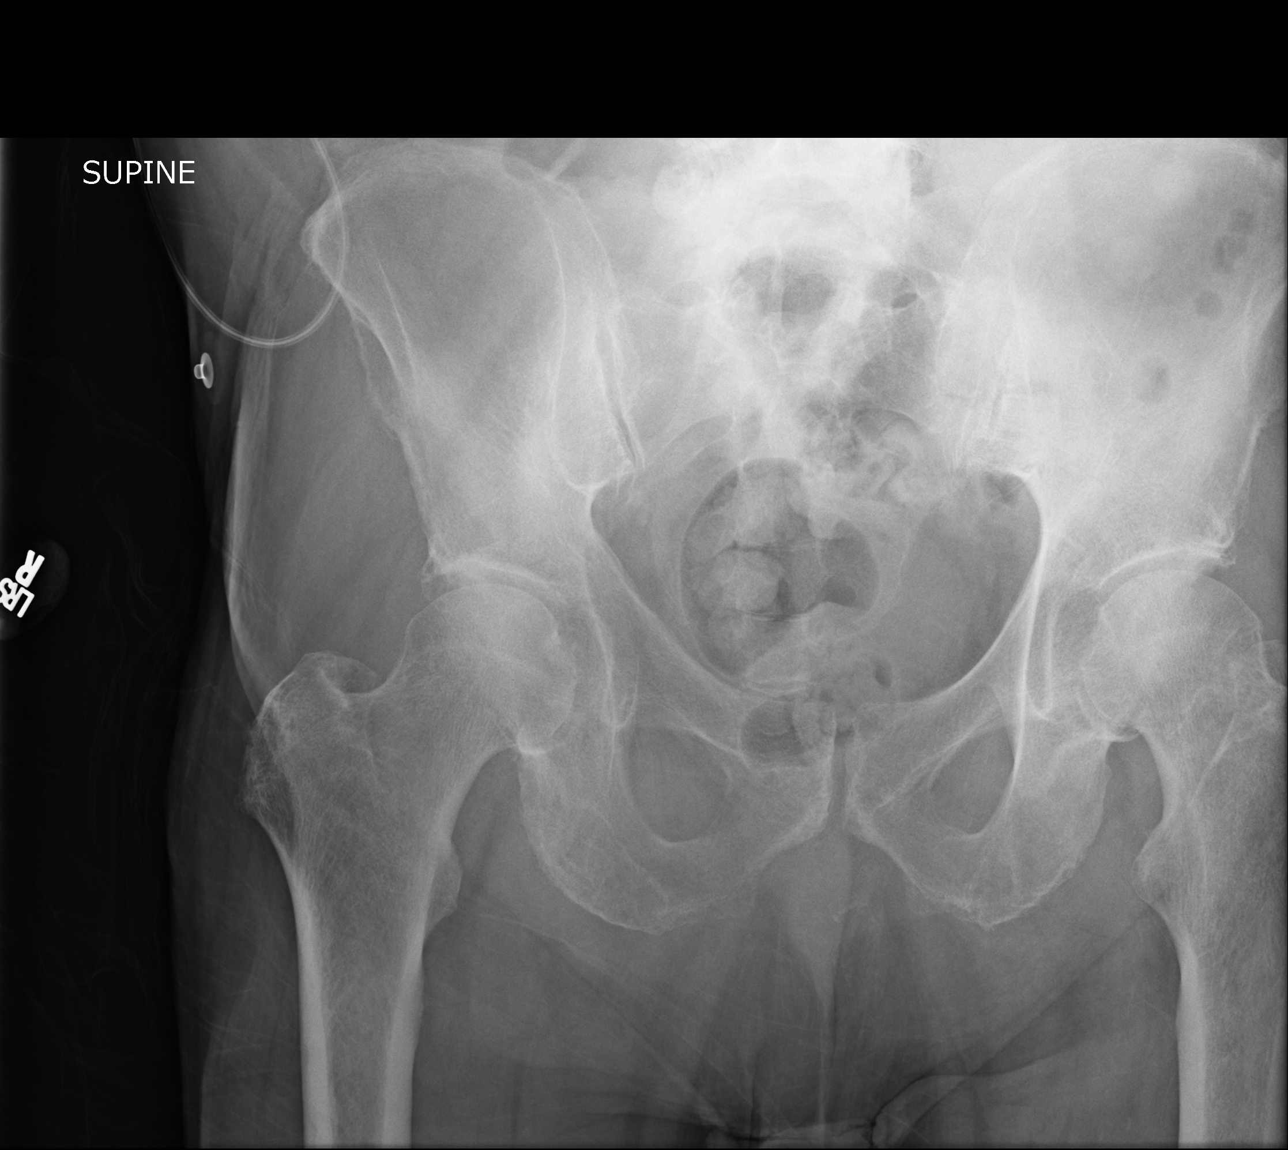

[lat (3 of 3)]
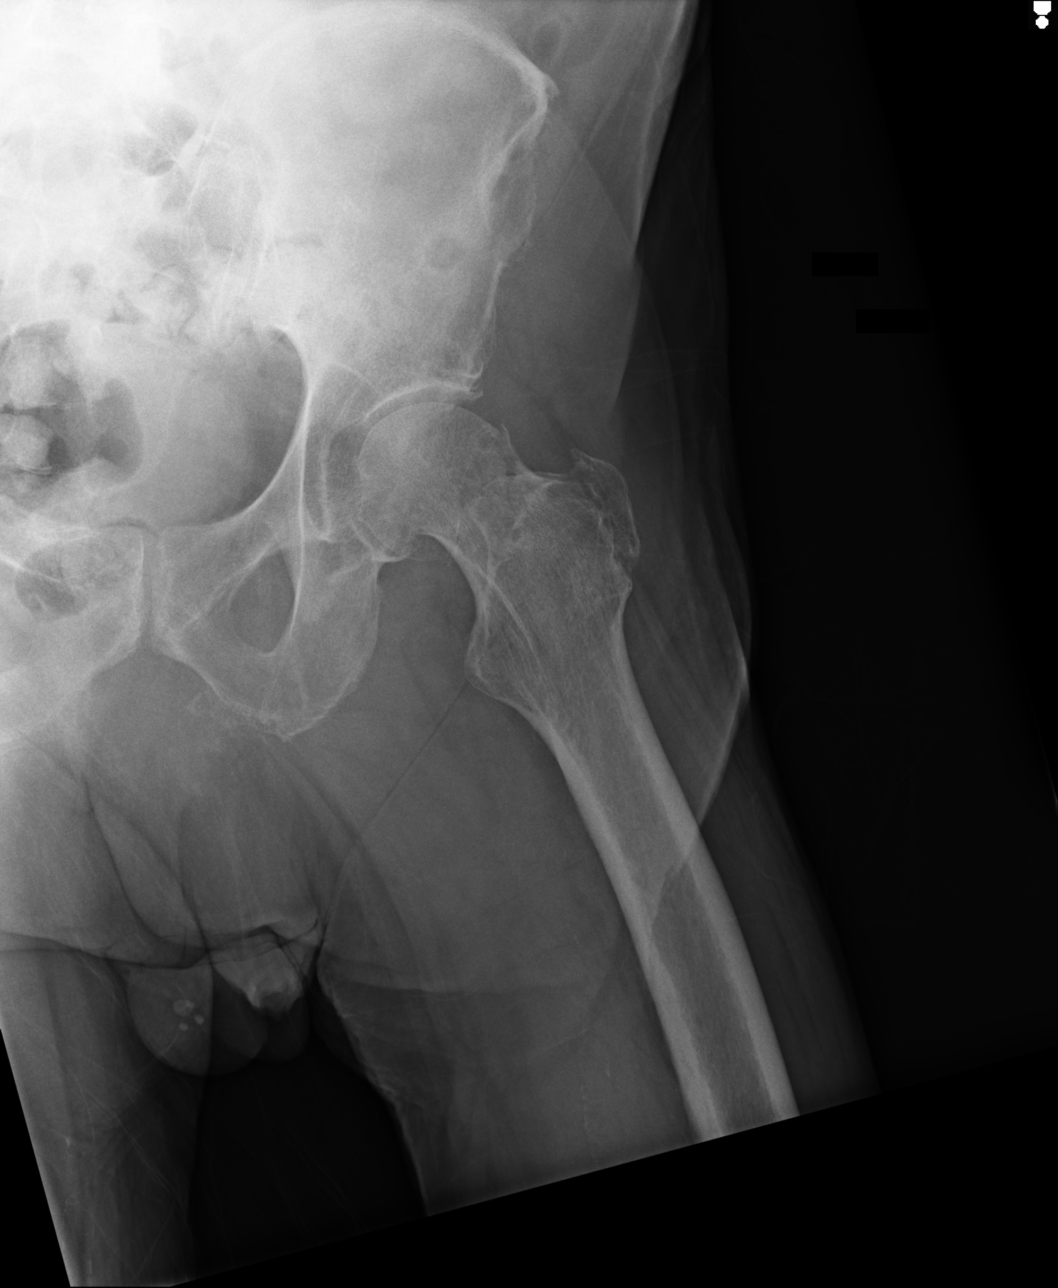

[4 of 4 positions shown; findings below may reference images not displayed]

FINDINGS: The bony pelvic ring appears intact, without fracture line
identified.

Bilateral hips projects normally over the acetabula.

Left-sided subcapital fracture of the femoral neck, identified on
prior CT. Impaction at the fracture site without significant
displacement.

Unremarkable appearance of the proximal right femur with no fracture
line identified. Degenerative changes of the bilateral hips
compatible with osteoarthritis.

Multilevel degenerative disc disease of the lumbar spine with facet
disease and anterior bridging osteophyte production.
IMPRESSION: Left-sided subcapital femoral neck fracture, identified on prior CT.
There is impaction at the fracture site without significant
displacement.
